# Patient Record
Sex: Male | Born: 2000 | Race: White | Hispanic: No | Marital: Single | Smoking: Never smoker
Health system: Southern US, Community
[De-identification: ages and names within clinical notes are randomized; demographics above are authoritative.]

## PROBLEM LIST (undated history)

## (undated) DIAGNOSIS — F909 Attention-deficit hyperactivity disorder, unspecified type: Secondary | ICD-10-CM

## (undated) DIAGNOSIS — G43909 Migraine, unspecified, not intractable, without status migrainosus: Secondary | ICD-10-CM

## (undated) DIAGNOSIS — K5090 Crohn's disease, unspecified, without complications: Secondary | ICD-10-CM

## (undated) DIAGNOSIS — Z8669 Personal history of other diseases of the nervous system and sense organs: Secondary | ICD-10-CM

## (undated) DIAGNOSIS — F840 Autistic disorder: Secondary | ICD-10-CM

## (undated) DIAGNOSIS — T7840XA Allergy, unspecified, initial encounter: Secondary | ICD-10-CM

## (undated) HISTORY — DX: Personal history of other diseases of the nervous system and sense organs: Z86.69

## (undated) HISTORY — DX: Migraine, unspecified, not intractable, without status migrainosus: G43.909

## (undated) HISTORY — DX: Allergy, unspecified, initial encounter: T78.40XA

## (undated) HISTORY — PX: TYMPANOSTOMY TUBE PLACEMENT: SHX32

---
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
## 2021-07-22 ENCOUNTER — Other Ambulatory Visit: Payer: Self-pay

## 2021-07-22 ENCOUNTER — Encounter (HOSPITAL_BASED_OUTPATIENT_CLINIC_OR_DEPARTMENT_OTHER): Payer: Self-pay

## 2021-07-22 ENCOUNTER — Emergency Department (HOSPITAL_BASED_OUTPATIENT_CLINIC_OR_DEPARTMENT_OTHER): Payer: Medicaid Other

## 2021-07-22 ENCOUNTER — Emergency Department (HOSPITAL_BASED_OUTPATIENT_CLINIC_OR_DEPARTMENT_OTHER)
Admission: EM | Admit: 2021-07-22 | Discharge: 2021-07-22 | Disposition: A | Discharge status: Home / Self Care | Payer: Medicaid Other | Attending: Emergency Medicine | Admitting: Emergency Medicine

## 2021-07-22 DIAGNOSIS — Z91010 Allergy to peanuts: Secondary | ICD-10-CM | POA: Insufficient documentation

## 2021-07-22 DIAGNOSIS — R110 Nausea: Secondary | ICD-10-CM | POA: Diagnosis not present

## 2021-07-22 DIAGNOSIS — K5900 Constipation, unspecified: Secondary | ICD-10-CM | POA: Diagnosis not present

## 2021-07-22 DIAGNOSIS — R1031 Right lower quadrant pain: Secondary | ICD-10-CM | POA: Diagnosis present

## 2021-07-22 LAB — CBC
HCT: 40.1 % (ref 39.0–52.0)
Hemoglobin: 13.1 g/dL (ref 13.0–17.0)
MCH: 28.5 pg (ref 26.0–34.0)
MCHC: 32.7 g/dL (ref 30.0–36.0)
MCV: 87.2 fL (ref 80.0–100.0)
Platelets: 379 10*3/uL (ref 150–400)
RBC: 4.6 MIL/uL (ref 4.22–5.81)
RDW: 12.7 % (ref 11.5–15.5)
WBC: 12.9 10*3/uL — ABNORMAL HIGH (ref 4.0–10.5)
nRBC: 0 % (ref 0.0–0.2)

## 2021-07-22 LAB — URINALYSIS, ROUTINE W REFLEX MICROSCOPIC
Bilirubin Urine: NEGATIVE
Glucose, UA: NEGATIVE mg/dL
Hgb urine dipstick: NEGATIVE
Ketones, ur: NEGATIVE mg/dL
Leukocytes,Ua: NEGATIVE
Nitrite: NEGATIVE
Specific Gravity, Urine: 1.024 (ref 1.005–1.030)
pH: 7 (ref 5.0–8.0)

## 2021-07-22 LAB — COMPREHENSIVE METABOLIC PANEL
ALT: 47 U/L — ABNORMAL HIGH (ref 0–44)
AST: 50 U/L — ABNORMAL HIGH (ref 15–41)
Albumin: 4.4 g/dL (ref 3.5–5.0)
Alkaline Phosphatase: 64 U/L (ref 38–126)
Anion gap: 11 (ref 5–15)
BUN: 12 mg/dL (ref 6–20)
CO2: 24 mmol/L (ref 22–32)
Calcium: 9.7 mg/dL (ref 8.9–10.3)
Chloride: 102 mmol/L (ref 98–111)
Creatinine, Ser: 0.84 mg/dL (ref 0.61–1.24)
GFR, Estimated: >60 mL/min (ref >60)
Glucose, Bld: 105 mg/dL — ABNORMAL HIGH (ref 70–99)
Potassium: 4.9 mmol/L (ref 3.5–5.1)
Sodium: 137 mmol/L (ref 135–145)
Total Bilirubin: 0.2 mg/dL — ABNORMAL LOW (ref 0.3–1.2)
Total Protein: 8.2 g/dL — ABNORMAL HIGH (ref 6.5–8.1)

## 2021-07-22 LAB — LIPASE, BLOOD: Lipase: 21 U/L (ref 11–51)

## 2021-07-22 MED ORDER — ONDANSETRON HCL 4 MG/2ML IJ SOLN
4.0000 mg | Freq: Once | INTRAMUSCULAR | Status: AC
Start: 2021-07-22 — End: 2021-07-22
  Administered 2021-07-22: 4 mg via INTRAVENOUS
  Filled 2021-07-22: qty 2

## 2021-07-22 MED ORDER — SODIUM CHLORIDE 0.9 % IV BOLUS
1000.0000 mL | Freq: Once | INTRAVENOUS | Status: AC
  Administered 2021-07-22: 1000 mL via INTRAVENOUS

## 2021-07-22 MED ORDER — IOHEXOL 300 MG/ML  SOLN
100.0000 mL | Freq: Once | INTRAMUSCULAR | Status: AC | PRN
  Administered 2021-07-22: 100 mL via INTRAVENOUS

## 2021-07-22 MED ORDER — POLYETHYLENE GLYCOL 3350 17 G PO PACK: 17.0000 g | PACK | Freq: Every day | ORAL | 0 refills | Status: DC

## 2021-07-22 NOTE — Discharge Instructions (Signed)
Recommend using MiraLAX for constipation.  Use 1-2 capfuls in 20 ounces of water and titrate to effect.  Can repeat daily.

## 2021-07-22 NOTE — ED Provider Notes (Signed)
[facility] Provider Note   CSN: [id number] Arrival date & time: 07/22/21  1854     History  Chief Complaint  Patient presents with   Abdominal Pain    Gregoire Favinger is a 21 y.o. male.  The history is provided by the patient.  Abdominal Pain Pain location:  RLQ Pain quality: aching   Pain radiates to:  Does not radiate Pain severity:  Mild Onset quality:  Gradual Duration:  1 day Timing:  Constant Progression:  Unchanged Chronicity:  New Context comment:  Hx of chrons on methotrexate Relieved by:  Nothing Worsened by:  Nothing Associated symptoms: nausea   Associated symptoms: no anorexia, no belching, no chest pain, no chills, no constipation, no cough, no diarrhea, no dysuria, no fatigue, no fever, no flatus, no hematemesis, no hematochezia, no melena, no shortness of breath, no sore throat and no vomiting   Risk factors: has not had multiple surgeries       Home Medications Prior to Admission medications   Medication Sig Start Date End Date Taking? Authorizing Provider  polyethylene glycol (MIRALAX / GLYCOLAX) 17 g packet Take 17 g by mouth daily. 07/22/21  Yes Felisa Zechman, DO  cloNIDine (CATAPRES) 0.2 MG tablet Take 0.4 mg by mouth at bedtime.    [provider]  CLONIDINE HCL PO Take 4 mg by mouth daily at 8 pm.    [provider]  EPINEPHrine 0.3 mg/0.3 mL IJ SOAJ injection Inject 0.3 mg into the muscle daily as needed (allergic reaction).    [provider]  fluticasone (FLONASE) 50 MCG/ACT nasal spray Place 1 spray into both nostrils daily.    [provider]  lamoTRIgine (LAMICTAL) 100 MG tablet Take 100 mg by mouth 2 (two) times daily.    [provider]  loratadine (CLARITIN) 10 MG tablet Take 10 mg by mouth every morning.    [provider]  methylphenidate [facility]) 15 mg/9hr Place 20 mg onto the skin daily. wear patch for 9 hours only each day    [provider]   montelukast (SINGULAIR) 5 MG chewable tablet Chew 5 mg by mouth at bedtime.    [provider]  ondansetron (ZOFRAN) 4 MG tablet Take 1 tablet (4 mg total) by mouth every 6 (six) hours. 03/18/14   Janne Napoleon, NP  ondansetron (ZOFRAN) 4 MG tablet Take 4 mg by mouth every 8 (eight) hours as needed for nausea or vomiting.    [provider]      Allergies    Peanuts [peanut oil]    Review of Systems   Review of Systems  Constitutional:  Negative for chills, fatigue and fever.  HENT:  Negative for sore throat.   Respiratory:  Negative for cough and shortness of breath.   Cardiovascular:  Negative for chest pain.  Gastrointestinal:  Positive for abdominal pain and nausea. Negative for anorexia, constipation, diarrhea, flatus, hematemesis, hematochezia, melena and vomiting.  Genitourinary:  Negative for dysuria.   Physical Exam Updated Vital Signs BP 131/76 (BP Location: Right Arm)    Pulse 100    Temp 98.2 F (36.8 C) (Oral)    Resp 20    Ht 5\' 7"  (1.702 m)    Wt 80.7 kg    SpO2 100%    BMI 27.88 kg/m  Physical Exam Vitals and nursing note reviewed.  Constitutional:      General: He is not in acute distress.    Appearance: He is well-developed.  HENT:  Head: Normocephalic and atraumatic.  Eyes:     Extraocular Movements: Extraocular movements intact.     Conjunctiva/sclera: Conjunctivae normal.  Cardiovascular:     Rate and Rhythm: Normal rate and regular rhythm.     Heart sounds: Normal heart sounds. No murmur heard. Pulmonary:     Effort: Pulmonary effort is normal. No respiratory distress.     Breath sounds: Normal breath sounds.  Abdominal:     Palpations: Abdomen is soft.     Tenderness: There is abdominal tenderness in the right lower quadrant.  Musculoskeletal:        General: No swelling.     Cervical back: Neck supple.  Skin:    General: Skin is warm and dry.     Capillary Refill: Capillary refill takes less than 2 seconds.  Neurological:      General: No focal deficit present.     Mental Status: He is alert.  Psychiatric:        Mood and Affect: Mood normal.    ED Results / Procedures / Treatments   Labs (all labs ordered are listed, but only abnormal results are displayed) Labs Reviewed  COMPREHENSIVE METABOLIC PANEL - Abnormal; Notable for the following components:      Result Value   Glucose, Bld 105 (*)    Total Protein 8.2 (*)    AST 50 (*)    ALT 47 (*)    Total Bilirubin 0.2 (*)    All other components within normal limits  URINALYSIS, ROUTINE W REFLEX MICROSCOPIC - Abnormal; Notable for the following components:   APPearance CLOUDY (*)    Protein, ur TRACE (*)    Bacteria, UA FEW (*)    Non Squamous Epithelial 0-5 (*)    All other components within normal limits  CBC - Abnormal; Notable for the following components:   WBC 12.9 (*)    All other components within normal limits  LIPASE, BLOOD    EKG None  Radiology CT ABDOMEN PELVIS W CONTRAST  Result Date: 07/22/2021 CLINICAL DATA:  Right lower quadrant pain EXAM: CT ABDOMEN AND PELVIS WITH CONTRAST TECHNIQUE: Multidetector CT imaging of the abdomen and pelvis was performed using the standard protocol following bolus administration of intravenous contrast. RADIATION DOSE REDUCTION: This exam was performed according to the departmental dose-optimization program which includes automated exposure control, adjustment of the mA and/or kV according to patient size and/or use of iterative reconstruction technique. CONTRAST:  144mL OMNIPAQUE IOHEXOL 300 MG/ML  SOLN COMPARISON:  None. FINDINGS: Lower chest: No acute abnormality. Small hiatal hernia with mild circumferential distal esophageal thickening. Hepatobiliary: No focal liver abnormality is seen. No gallstones, gallbladder wall thickening, or biliary dilatation. Pancreas: Unremarkable. No pancreatic ductal dilatation or surrounding inflammatory changes. Spleen: Normal in size without focal abnormality.  Adrenals/Urinary Tract: Adrenal glands are unremarkable. Kidneys are normal, without renal calculi, focal lesion, or hydronephrosis. Bladder is unremarkable. Stomach/Bowel: Stomach is within normal limits. Appendix appears normal. No evidence of bowel wall thickening, distention, or inflammatory changes. Large stool throughout the colon. Vascular/Lymphatic: No significant vascular findings are present. No enlarged abdominal or pelvic lymph nodes. Reproductive: Prostate is unremarkable. Other: 2 metallic densities overlying the low pelvis on scout image are not identified on the CT images and may represent artifacts. Musculoskeletal: No acute or significant osseous findings. IMPRESSION: 1. No CT evidence for acute intra-abdominal or pelvic abnormality. Negative for acute appendicitis 2. Large stool burden suggesting constipation Electronically Signed   By: Donavan Foil M.D.   On:  07/22/2021 21:40    Procedures Procedures    Medications Ordered in ED Medications  ondansetron (ZOFRAN) injection 4 mg (has no administration in time range)  sodium chloride 0.9 % bolus 1,000 mL (has no administration in time range)  iohexol (OMNIPAQUE) 300 MG/ML solution 100 mL (100 mLs Intravenous Contrast Given 07/22/21 2121)    ED Course/ Medical Decision Making/ A&P                           Medical Decision Making Amount and/or Complexity of Data Reviewed Labs: ordered. Radiology: ordered.  Risk Prescription drug management.   Alexnader Rucci is here with right lower quadrant abdominal pain.  Pain for the last several hours.  Normal vitals.  No fever.  History of Crohn's on methotrexate.  Denies any bloody stools.  Has had some nausea.  Tender in the right lower quadrant on exam.  No testicular pain or GU issue.  Will get CT scan to evaluate for appendicitis as differential diagnosis includes appendicitis versus Crohn's flare versus constipation versus viral process.  CBC, CMP, lipase also ordered.  Will give  IV fluid bolus, IV Zofran.  Work has been reviewed and interpreted.  There is no significant anemia, electrolyte abnormality, kidney injury.  Lipase is normal.  Gallbladder and liver enzymes within normal limits.  Doubt cholecystitis or pancreatitis.  Awaiting CT scan abdomen and pelvis.  Per radiology report CT scan consistent with constipation.  No appendicitis or other acute process.  This chart was dictated using voice recognition software.  Despite best efforts to proofread,  errors can occur which can change the documentation meaning.         Final Clinical Impression(s) / ED Diagnoses Final diagnoses:  Constipation, unspecified constipation type    Rx / DC Orders ED Discharge Orders          Ordered    polyethylene glycol (MIRALAX / GLYCOLAX) 17 g packet  Daily        07/22/21 2145              Lennice Sites, DO 07/22/21 2145

## 2021-07-22 NOTE — ED Triage Notes (Signed)
Patient here POV from Home with ABD Pain.  Began this Am somewhat and symptoms worsened throughout this afternoon. Pain is Mid ABD and radiates to Right ABD.  1G of Tylenol 1 Hour PTA. Moderate Nausea. No Emesis. History of Chron's and Autism.  NAD noted during Triage. Ambulatory.

## 2021-09-14 ENCOUNTER — Emergency Department (HOSPITAL_BASED_OUTPATIENT_CLINIC_OR_DEPARTMENT_OTHER): Payer: Medicaid Other | Admitting: Radiology

## 2021-09-14 ENCOUNTER — Other Ambulatory Visit: Payer: Self-pay

## 2021-09-14 ENCOUNTER — Encounter (HOSPITAL_BASED_OUTPATIENT_CLINIC_OR_DEPARTMENT_OTHER): Payer: Self-pay | Admitting: Emergency Medicine

## 2021-09-14 ENCOUNTER — Emergency Department (HOSPITAL_BASED_OUTPATIENT_CLINIC_OR_DEPARTMENT_OTHER)
Admission: EM | Admit: 2021-09-14 | Discharge: 2021-09-14 | Disposition: A | Discharge status: Home / Self Care | Payer: Medicaid Other | Attending: Emergency Medicine | Admitting: Emergency Medicine

## 2021-09-14 DIAGNOSIS — Z20822 Contact with and (suspected) exposure to covid-19: Secondary | ICD-10-CM | POA: Diagnosis not present

## 2021-09-14 DIAGNOSIS — R059 Cough, unspecified: Secondary | ICD-10-CM | POA: Diagnosis present

## 2021-09-14 DIAGNOSIS — J069 Acute upper respiratory infection, unspecified: Secondary | ICD-10-CM | POA: Diagnosis not present

## 2021-09-14 DIAGNOSIS — Z91010 Allergy to peanuts: Secondary | ICD-10-CM | POA: Insufficient documentation

## 2021-09-14 LAB — RESP PANEL BY RT-PCR (FLU A&B, COVID) ARPGX2
Influenza A by PCR: NEGATIVE
Influenza B by PCR: NEGATIVE
SARS Coronavirus 2 by RT PCR: NEGATIVE

## 2021-09-14 MED ORDER — ONDANSETRON 4 MG PO TBDP
4.0000 mg | ORAL_TABLET | Freq: Once | ORAL | Status: AC
  Administered 2021-09-14: 4 mg via ORAL
  Filled 2021-09-14: qty 1

## 2021-09-14 MED ORDER — KETOROLAC TROMETHAMINE 15 MG/ML IJ SOLN
15.0000 mg | Freq: Once | INTRAMUSCULAR | Status: AC
  Administered 2021-09-14: 15 mg via INTRAMUSCULAR
  Filled 2021-09-14: qty 1

## 2021-09-14 MED ORDER — ONDANSETRON HCL 4 MG PO TABS: 4.0000 mg | ORAL_TABLET | Freq: Three times a day (TID) | ORAL | 0 refills | Status: DC | PRN

## 2021-09-14 NOTE — Discharge Instructions (Addendum)
It was a pleasure taking care of you!  ? ?Your COVID swab was negative and your Flu swab was negative today. Your chest xray didn't show any acute abnormalities. You may continue with over the counter cough and cold medications as needed for your symptoms. You will be sent a prescription for zofran, take as directed. Ensure to maintain fluid intake with tea, soup, broth, Pedialyte, Gatorade, water. You may follow-up with your primary care provider as needed.  Return to the Emergency Department if you are experiencing trouble breathing, worsening or increasing chest pain, decreased fluid intake or worsening symptoms. ?

## 2021-09-14 NOTE — ED Triage Notes (Signed)
Cough/congestion/low grade fever 100, home covid test negative . Started headache and vomiting started few hours ago.  ?

## 2021-09-14 NOTE — ED Provider Notes (Signed)
?[facility] EMER[facility] DEPT ?Provider Note ? ? ?CSN: [id number] ?Arrival date & time: 09/14/21  1555 ? ?  ? ?History ? ?Chief Complaint  ?Patient presents with  ? Cough  ? ? ?Arthur Dalton is a 21 y.o. male who presents to the emergency department complaining of nonproductive cough onset last Wednesday (09/04/2021).  Patient's mother notes that he had multiple at home COVID test that resulted as negative.  He has had associated headache onset yesterday, vomiting this morning, low-grade temperature at home.  Denies sick contacts.  Patient has been given over-the-counter NyQuil, Excedrin, Mucinex, DayQuil for his symptoms.  Mother denies the patient having any allergies to medications.  Patient denies rhinorrhea. ? ? ? ?The history is provided by a relative and the patient. No language interpreter was used.  ? ?  ? ?Home Medications ?Prior to Admission medications   ?Medication Sig Start Date End Date Taking? Authorizing Provider  ?ondansetron (ZOFRAN) 4 MG tablet Take 1 tablet (4 mg total) by mouth every 8 (eight) hours as needed for nausea or vomiting. 09/14/21  Yes Rhyland Hinderliter A, PA-C  ?cloNIDine (CATAPRES) 0.2 MG tablet Take 0.4 mg by mouth at bedtime.    [provider]  ?CLONIDINE HCL PO Take 4 mg by mouth daily at 8 pm.    [provider]  ?EPINEPHrine 0.3 mg/0.3 mL IJ SOAJ injection Inject 0.3 mg into the muscle daily as needed (allergic reaction).    [provider]  ?fluticasone (FLONASE) 50 MCG/ACT nasal spray Place 1 spray into both nostrils daily.    [provider]  ?lamoTRIgine (LAMICTAL) 100 MG tablet Take 100 mg by mouth 2 (two) times daily.    [provider]  ?loratadine (CLARITIN) 10 MG tablet Take 10 mg by mouth every morning.    [provider]  ?methylphenidate (DAYTRANA) 15 mg/9hr Place 20 mg onto the skin daily. wear patch for 9 hours only each day    [provider]  ?montelukast (SINGULAIR) 5 MG chewable tablet Chew 5  mg by mouth at bedtime.    [provider]  ?polyethylene glycol (MIRALAX / GLYCOLAX) 17 g packet Take 17 g by mouth daily. 07/22/21   Virgina Norfolk, DO  ?   ? ?Allergies    ?Peanuts [peanut oil]   ? ?Review of Systems   ?Review of Systems  ?Constitutional:  Positive for fever.  ?HENT:  Positive for congestion. Negative for rhinorrhea.   ?Respiratory:  Positive for cough.   ?All other systems reviewed and are negative. ? ?Physical Exam ?Updated Vital Signs ?BP 129/87 (BP Location: Right Arm)   Pulse (!) 105   Temp 98.1 ?F (36.7 ?C) (Oral)   Resp 18   SpO2 99%  ?Physical Exam ?Vitals and nursing note reviewed.  ?Constitutional:   ?   General: He is not in acute distress. ?   Appearance: He is not diaphoretic.  ?HENT:  ?   Head: Normocephalic and atraumatic.  ?   Right Ear: Tympanic membrane, ear canal and external ear normal.  ?   Left Ear: Tympanic membrane, ear canal and external ear normal.  ?   Mouth/Throat:  ?   Mouth: Mucous membranes are moist.  ?   Pharynx: Oropharynx is clear. Uvula midline. No oropharyngeal exudate, posterior oropharyngeal erythema or uvula swelling.  ?   Tonsils: No tonsillar exudate.  ?Eyes:  ?   General: No scleral icterus. ?   Conjunctiva/sclera: Conjunctivae normal.  ?Cardiovascular:  ?   Rate and  Rhythm: Normal rate and regular rhythm.  ?   Pulses: Normal pulses.  ?   Heart sounds: Normal heart sounds.  ?Pulmonary:  ?   Effort: Pulmonary effort is normal. No respiratory distress.  ?   Breath sounds: Normal breath sounds. No wheezing.  ?Abdominal:  ?   General: Bowel sounds are normal.  ?   Palpations: Abdomen is soft. There is no mass.  ?   Tenderness: There is no abdominal tenderness. There is no guarding or rebound.  ?Musculoskeletal:     ?   General: Normal range of motion.  ?   Cervical back: Normal range of motion and neck supple.  ?Skin: ?   General: Skin is warm and dry.  ?Neurological:  ?   Mental Status: He is alert.  ?Psychiatric:     ?   Behavior: Behavior  normal.  ? ? ?ED Results / Procedures / Treatments   ?Labs ?(all labs ordered are listed, but only abnormal results are displayed) ?Labs Reviewed  ?RESP PANEL BY RT-PCR (FLU A&B, COVID) [id number]  ? ? ?EKG ?None ? ?Radiology ?DG Chest 2 View ? ?Result Date: 09/14/2021 ?CLINICAL DATA:  Cough, congestion and low-grade fever. Headache and vomiting. EXAM: CHEST - 2 VIEW COMPARISON:  Radiographs 03/20/2014. FINDINGS: Suboptimal inspiration on both views. The heart size and mediastinal contours are stable. Probable mild central airway thickening and bibasilar atelectasis. No confluent airspace opacity, pleural effusion or pneumothorax. The bones appear unremarkable. IMPRESSION: Suboptimal inspiration with probable bibasilar atelectasis and mild underlying central airway thickening as can be seen with viral infection. No evidence of pneumonia. Electronically Signed   By: Richardean Sale M.D.   On: 09/14/2021 18:23   ? ?Procedures ?Procedures  ? ? ?Medications Ordered in ED ?Medications  ?ondansetron (ZOFRAN-ODT) disintegrating tablet 4 mg (4 mg Oral Given 09/14/21 1827)  ?ketorolac (TORADOL) 15 MG/ML injection 15 mg (15 mg Intramuscular Given 09/14/21 1827)  ? ? ?ED Course/ Medical Decision Making/ A&P ?Clinical Course as of 09/14/21 1958  ?Sat Sep 14, 2021  ?1929 Discussed with mother regarding x-ray.  Patient reevaluated and noted improvement of his symptoms with treatment regimen in the ED.  Discussed discharge treatment plan.  Answered all available questions.  Patient for safe discharge. [SB]  ?  ?Clinical Course User Index ?[SB] Candies Palm A, PA-C  ? ?                        ?Medical Decision Making ?Amount and/or Complexity of Data Reviewed ?Radiology: ordered. ? ?Risk ?Prescription drug management. ? ? ?Pt presents with nonproductive cough, nasal congestion, low-grade fever at home onset 09/04/2021.  Mother denies sick contacts.  Patient has been given over-the-counter medications for his symptoms.  Vital signs stable,  patient afebrile, not tachycardic or hypoxic.  On exam patient without acute cardiovascular, respiratory, abdominal skin findings.  Differential diagnosis includes COVID, flu, pneumonia, viral URI with cough. ? ?Additional history obtained:  ?Additional history obtained from Parent ? ?Labs:  ?I ordered, and personally interpreted labs.  The pertinent results include:   ?COVID and flu swab negative ? ?Imaging: ?I ordered imaging studies including chest x-ray ?I independently visualized and interpreted imaging which showed: No acute cardiopulmonary findings. ?I agree with the radiologist interpretation ? ?Medications:  ?I ordered medication including Toradol and Zofran for symptom management ?Reevaluation of the patient after these medicines and interventions, I reevaluated the patient and found that they have improved ?I have reviewed the patients home  medicines and have made adjustments as needed ? ? ?Disposition: ?Patient presentation suspicious for viral URI with cough.  Doubt COVID, flu, pneumonia at this time. After consideration of the diagnostic results and the patients response to treatment, I feel that the patient would benefit from Discharge home.  Discussed with patient's mother regarding discharge treatment plan.  We will send a prescription for Zofran to use as needed.  Supportive care measures and strict return precautions discussed with patient's mother at bedside.  Patient mother acknowledges and verbalized understanding.  Patient for safe discharge.  Follow-up as indicated discharge paperwork.   ? ? ?This chart was dictated using voice recognition software, Dragon. Despite the best efforts of this provider to proofread and correct errors, errors may still occur which can change documentation meaning. ? ? ?Final Clinical Impression(s) / ED Diagnoses ?Final diagnoses:  ?Viral URI with cough  ? ? ?Rx / DC Orders ?ED Discharge Orders   ? ?      Ordered  ?  ondansetron (ZOFRAN) 4 MG tablet  Every 8 hours  PRN       ? 09/14/21 1927  ? ?  ?  ? ?  ? ? ?  ?Mallerie Blok A, PA-C ?09/14/21 2002 ? ?  ?Wyvonnia Dusky, MD ?09/15/21 [phone] ? ?

## 2021-10-21 ENCOUNTER — Encounter (HOSPITAL_COMMUNITY): Payer: Self-pay | Admitting: Emergency Medicine

## 2021-10-21 ENCOUNTER — Ambulatory Visit (HOSPITAL_COMMUNITY)
Admission: EM | Admit: 2021-10-21 | Discharge: 2021-10-21 | Disposition: A | Discharge status: Home / Self Care | Payer: Medicaid Other | Attending: Internal Medicine | Admitting: Internal Medicine

## 2021-10-21 ENCOUNTER — Ambulatory Visit (INDEPENDENT_AMBULATORY_CARE_PROVIDER_SITE_OTHER): Payer: Medicaid Other

## 2021-10-21 DIAGNOSIS — R053 Chronic cough: Secondary | ICD-10-CM

## 2021-10-21 DIAGNOSIS — R059 Cough, unspecified: Secondary | ICD-10-CM | POA: Diagnosis not present

## 2021-10-21 DIAGNOSIS — R509 Fever, unspecified: Secondary | ICD-10-CM

## 2021-10-21 MED ORDER — BENZONATATE 100 MG PO CAPS: 100.0000 mg | ORAL_CAPSULE | Freq: Three times a day (TID) | ORAL | 0 refills | Status: DC | PRN

## 2021-10-21 MED ORDER — PREDNISONE 10 MG (21) PO TBPK: ORAL_TABLET | Freq: Every day | ORAL | 0 refills | Status: DC

## 2021-10-21 NOTE — ED Provider Notes (Signed)
?[facility] ? ? ? ?CSN: [id number] ?Arrival date & time: 10/21/21  1521 ? ? ?  ? ?History   ?Chief Complaint ?Chief Complaint  ?Patient presents with  ? Cough  ? Fever  ? ? ?HPI ?Arthur Dalton is a 21 y.o. male.  ? ?Patient presents with cough and fever.  Cough has been present for approximately 2 months per parent.  Fever developed a few days ago with Tmax of 100.  Denies any associated upper respiratory symptoms.  Patient was seen at ED when symptoms first started and a full work-up that concluded that a viral illness was present.  Parent denies any associated asthma or wheezing.  Parent denies noticing any rapid breathing or shortness of breath. ? ? ?Cough ?Fever ? ?Past Medical History:  ?Diagnosis Date  ? ADHD (attention deficit hyperactivity disorder)   ? Autism   ? Crohn disease (HCC)   ? ? ?There are no problems to display for this patient. ? ? ?Past Surgical History:  ?Procedure Laterality Date  ? TYMPANOSTOMY TUBE PLACEMENT    ? ? ? ? ? ?Home Medications   ? ?Prior to Admission medications   ?Medication Sig Start Date End Date Taking? Authorizing Provider  ?benzonatate (TESSALON) 100 MG capsule Take 1 capsule (100 mg total) by mouth every 8 (eight) hours as needed for cough. 10/21/21  Yes Gustavus Bryant, FNP  ?predniSONE (STERAPRED UNI-PAK 21 TAB) 10 MG (21) TBPK tablet Take by mouth daily. Take 6 tabs by mouth daily  for 2 days, then 5 tabs for 2 days, then 4 tabs for 2 days, then 3 tabs for 2 days, 2 tabs for 2 days, then 1 tab by mouth daily for 2 days 10/21/21  Yes [place], Rolly Salter E, FNP  ?cloNIDine (CATAPRES) 0.2 MG tablet Take 0.4 mg by mouth at bedtime.    [provider]  ?CLONIDINE HCL PO Take 4 mg by mouth daily at 8 pm.    [provider]  ?EPINEPHrine 0.3 mg/0.3 mL IJ SOAJ injection Inject 0.3 mg into the muscle daily as needed (allergic reaction).    [provider]  ?fluticasone (FLONASE) 50 MCG/ACT nasal spray Place 1 spray into both nostrils daily.     [provider]  ?lamoTRIgine (LAMICTAL) 100 MG tablet Take 100 mg by mouth 2 (two) times daily.    [provider]  ?loratadine (CLARITIN) 10 MG tablet Take 10 mg by mouth every morning.    [provider]  ?methylphenidate (DAYTRANA) 15 mg/9hr Place 20 mg onto the skin daily. wear patch for 9 hours only each day    [provider]  ?montelukast (SINGULAIR) 5 MG chewable tablet Chew 5 mg by mouth at bedtime.    [provider]  ?ondansetron (ZOFRAN) 4 MG tablet Take 1 tablet (4 mg total) by mouth every 8 (eight) hours as needed for nausea or vomiting. 09/14/21   Blue, Soijett A, PA-C  ?polyethylene glycol (MIRALAX / GLYCOLAX) 17 g packet Take 17 g by mouth daily. 07/22/21   Virgina Norfolk, DO  ? ? ?Family History ?Family History  ?Problem Relation Age of Onset  ? Hypertension Mother   ? Hypertension Father   ? ? ?Social History ?Social History  ? ?Tobacco Use  ? Smoking status: Never  ?Substance Use Topics  ? Alcohol use: Never  ? Drug use: Never  ? ? ? ?Allergies   ?Peanuts [peanut oil] ? ? ?Review of Systems ?Review of Systems ?Per HPI ? ?Physical Exam ?  Triage Vital Signs ?ED Triage Vitals [10/21/21 1657]  ?Enc Vitals Group  ?   BP 113/76  ?   Pulse Rate 80  ?   Resp 18  ?   Temp 98.3 ?F (36.8 ?C)  ?   Temp Source Oral  ?   SpO2 98 %  ?   Weight   ?   Height   ?   Head Circumference   ?   Peak Flow   ?   Pain Score 0  ?   Pain Loc   ?   Pain Edu?   ?   Excl. in GC?   ? ?No data found. ? ?Updated Vital Signs ?BP 113/76   Pulse 80   Temp 98.3 ?F (36.8 ?C) (Oral)   Resp 18   SpO2 98%  ? ?Visual Acuity ?Right Eye Distance:   ?Left Eye Distance:   ?Bilateral Distance:   ? ?Right Eye Near:   ?Left Eye Near:    ?Bilateral Near:    ? ?Physical Exam ?Constitutional:   ?   General: He is not in acute distress. ?   Appearance: Normal appearance. He is not toxic-appearing or diaphoretic.  ?HENT:  ?   Head: Normocephalic and atraumatic.  ?   Right Ear: Tympanic membrane and ear  canal normal.  ?   Left Ear: Tympanic membrane and ear canal normal.  ?   Nose: Nose normal.  ?   Mouth/Throat:  ?   Mouth: Mucous membranes are moist.  ?   Pharynx: No posterior oropharyngeal erythema.  ?Eyes:  ?   Extraocular Movements: Extraocular movements intact.  ?   Conjunctiva/sclera: Conjunctivae normal.  ?   Pupils: Pupils are equal, round, and reactive to light.  ?Cardiovascular:  ?   Rate and Rhythm: Normal rate and regular rhythm.  ?   Pulses: Normal pulses.  ?   Heart sounds: Normal heart sounds.  ?Pulmonary:  ?   Effort: Pulmonary effort is normal. No respiratory distress.  ?   Breath sounds: Normal breath sounds. No stridor. No wheezing, rhonchi or rales.  ?Abdominal:  ?   General: Abdomen is flat. Bowel sounds are normal.  ?   Palpations: Abdomen is soft.  ?Musculoskeletal:     ?   General: Normal range of motion.  ?   Cervical back: Normal range of motion.  ?Skin: ?   General: Skin is warm and dry.  ?Neurological:  ?   General: No focal deficit present.  ?   Mental Status: He is alert and oriented to person, place, and time. Mental status is at baseline.  ?Psychiatric:     ?   Mood and Affect: Mood normal.     ?   Behavior: Behavior normal.  ? ? ? ?UC Treatments / Results  ?Labs ?(all labs ordered are listed, but only abnormal results are displayed) ?Labs Reviewed - No data to display ? ?EKG ? ? ?Radiology ?DG Chest 2 View ? ?Result Date: 10/21/2021 ?CLINICAL DATA:  Cough and fever in a 21 year old male. EXAM: CHEST - 2 VIEW COMPARISON:  September 14, 2021. FINDINGS: Trachea midline. Cardiomediastinal contours and hilar structures are normal. No sign of lobar consolidation. No sign of pleural effusion. No visible pneumothorax. On limited assessment there is no acute skeletal finding. IMPRESSION: No acute cardiopulmonary disease Electronically Signed   By: Donzetta KohutGeoffrey  Wile M.D.   On: 10/21/2021 17:24   ? ?Procedures ?Procedures (including critical care time) ? ?Medications Ordered in UC ?Medications -  No  data to display ? ?Initial Impression / Assessment and Plan / UC Course  ?I have reviewed the triage vital signs and the nursing notes. ? ?Pertinent labs & imaging results that were available during my care of the patient were reviewed by me and considered in my medical decision making (see chart for details). ? ?  ? ?Chest x-ray was negative for any acute cardiopulmonary process.  Suspect possible bronchitis.  No concern or need for antibiotics at this time given physical exam, no adventitious lung sounds, normal chest x-ray, no current fever, no acute distress on exam.  Will treat with prednisone steroid taper to decrease inflammation.  Discussed supportive care and fever monitoring and management.  Benzonatate prescribed to take as needed for cough.  Advised parent to have patient follow-up if symptoms persist or worsen.  Parent verbalized understanding and was agreeable with plan. ?Final Clinical Impressions(s) / UC Diagnoses  ? ?Final diagnoses:  ?Persistent cough for 3 weeks or longer  ? ? ? ?Discharge Instructions   ? ?  ?chest x-ray was completely normal.  Suspect possible bronchitis.  Prednisone has been prescribed to help alleviate inflammation associated with this.  No antibiotics are necessary at this time.  A cough medication has also been prescribed to take as needed.  Please follow-up if symptoms persist ? ? ? ?ED Prescriptions   ? ? Medication Sig Dispense Auth. Provider  ? predniSONE (STERAPRED UNI-PAK 21 TAB) 10 MG (21) TBPK tablet Take by mouth daily. Take 6 tabs by mouth daily  for 2 days, then 5 tabs for 2 days, then 4 tabs for 2 days, then 3 tabs for 2 days, 2 tabs for 2 days, then 1 tab by mouth daily for 2 days 42 tablet [place], [place] E, [place]  ? benzonatate (TESSALON) 100 MG capsule Take 1 capsule (100 mg total) by mouth every 8 (eight) hours as needed for cough. 21 capsule Ervin Knack E, [place]  ? ?  ? ?PDMP not reviewed this encounter. ?  ?Gustavus Bryant, [place] ?10/21/21 1740 ? ?

## 2021-10-21 NOTE — ED Triage Notes (Signed)
Pt is present today with cough and fever. Pt sx started x2 months ago.  ?

## 2021-10-21 NOTE — Discharge Instructions (Signed)
chest x-ray was completely normal.  Suspect possible bronchitis.  Prednisone has been prescribed to help alleviate inflammation associated with this.  No antibiotics are necessary at this time.  A cough medication has also been prescribed to take as needed.  Please follow-up if symptoms persist ?

## 2022-05-08 ENCOUNTER — Ambulatory Visit (HOSPITAL_COMMUNITY)
Admission: RE | Admit: 2022-05-08 | Discharge: 2022-05-08 | Disposition: A | Discharge status: Home / Self Care | Payer: Medicaid Other | Source: Ambulatory Visit

## 2022-05-08 ENCOUNTER — Encounter (HOSPITAL_COMMUNITY): Payer: Self-pay

## 2022-05-08 VITALS — BP 110/75 | HR 66 | Temp 98.0°F | Resp 18

## 2022-05-08 DIAGNOSIS — H6001 Abscess of right external ear: Secondary | ICD-10-CM | POA: Diagnosis not present

## 2022-05-08 MED ORDER — MUPIROCIN 2 % EX OINT: 1.0000 | TOPICAL_OINTMENT | Freq: Two times a day (BID) | CUTANEOUS | 0 refills | Status: DC

## 2022-05-08 MED ORDER — DOXYCYCLINE HYCLATE 100 MG PO TABS: 100.0000 mg | ORAL_TABLET | Freq: Two times a day (BID) | ORAL | 0 refills | Status: AC

## 2022-05-08 NOTE — ED Triage Notes (Signed)
Pts parents state that starting on 04/28/22 he started with some dry skin behind the right ear but its getting worse now it looks infected. They have been putting antibiotic ointment, cleaning it with antibacterial wipes.

## 2022-05-08 NOTE — ED Provider Notes (Signed)
[facility]    CSN: [id number] Arrival date & time: 05/08/22  1453      History   Chief Complaint Chief Complaint  Patient presents with   Ear Injury    Ear lobe and behind the ear is inflamed and skin is cracked and weeping. Ear lobe is swollen as well. Started as mild iritation behind the ear on or about the 20th of November. No improvement even with washing and ointments otc. - Entered by patient    HPI Arthur Dalton is a 21 y.o. male with autism.  History provided by mother and father in room with patient.  Parents report zit like lesion on right earlobe last month which drained purulent fluid.  Patient now with 10-day history of increasing redness and swelling to right earlobe.  Parents state patient has significant eczema usually resolved with CeraVe which has not worked in this situation.  Parents deny any recent fever or chills.  Patient on Remicade and methotrexate for history of Crohn's disease.    Past Medical History:  Diagnosis Date   ADHD (attention deficit hyperactivity disorder)    Autism    Crohn disease (HCC)     There are no problems to display for this patient.   Past Surgical History:  Procedure Laterality Date   TYMPANOSTOMY TUBE PLACEMENT         Home Medications    Prior to Admission medications   Medication Sig Start Date End Date Taking? Authorizing Provider  cloNIDine (CATAPRES) 0.2 MG tablet Take 0.4 mg by mouth at bedtime.   Yes [provider]  CLONIDINE HCL PO Take 4 mg by mouth daily at 8 pm.   Yes [provider]  doxycycline (VIBRA-TABS) 100 MG tablet Take 1 tablet (100 mg total) by mouth 2 (two) times daily for 5 days. 05/08/22 05/13/22 Yes Rolla Etienne, NP  EPINEPHrine 0.3 mg/0.3 mL IJ SOAJ injection Inject 0.3 mg into the muscle daily as needed (allergic reaction).   Yes [provider]  fluticasone (FLONASE) 50 MCG/ACT nasal spray Place 1 spray into both nostrils daily.   Yes [provider]  inFLIXimab (REMICADE IV) Inject into the vein. Every 6 weeks   Yes [provider]  lamoTRIgine (LAMICTAL) 100 MG tablet Take 100 mg by mouth 2 (two) times daily.   Yes [provider]  loratadine (CLARITIN) 10 MG tablet Take 10 mg by mouth every morning.   Yes [provider]  methotrexate (RHEUMATREX) 2.5 MG tablet Take by mouth. 03/24/22  Yes [provider]  mupirocin ointment (BACTROBAN) 2 % Apply 1 Application topically 2 (two) times daily. Apply twice daily as needed to affected area 05/08/22  Yes Rolla Etienne, NP  ondansetron (ZOFRAN) 4 MG tablet Take 1 tablet (4 mg total) by mouth every 8 (eight) hours as needed for nausea or vomiting. 09/14/21  Yes Blue, Soijett A, PA-C  risperiDONE (RISPERDAL) 1 MG tablet Take 1 mg by mouth at bedtime.   Yes [provider]  benzonatate (TESSALON) 100 MG capsule Take 1 capsule (100 mg total) by mouth every 8 (eight) hours as needed for cough. 10/21/21   Gustavus Bryant, FNP  methylphenidate [facility]) 15 mg/9hr Place 20 mg onto the skin daily. wear patch for 9 hours only each day    [provider]  montelukast (SINGULAIR) 5 MG chewable tablet Chew 5 mg by mouth at bedtime.    [provider]  polyethylene glycol (MIRALAX / GLYCOLAX) 17  g packet Take 17 g by mouth daily. 07/22/21   Curatolo, Adam, DO  predniSONE (STERAPRED UNI-PAK 21 TAB) 10 MG (21) TBPK tablet Take by mouth daily. Take 6 tabs by mouth daily  for 2 days, then 5 tabs for 2 days, then 4 tabs for 2 days, then 3 tabs for 2 days, 2 tabs for 2 days, then 1 tab by mouth daily for 2 days 10/21/21   Gustavus Bryant, FNP    Family History Family History  Problem Relation Age of Onset   Hypertension Mother    Hypertension Father     Social History Social History   Tobacco Use   Smoking status: Never  Substance Use Topics   Alcohol use: Never   Drug use: Never     Allergies   Peanuts [peanut oil]   Review of  Systems As stated in HPI otherwise negative   Physical Exam Triage Vital Signs ED Triage Vitals  Enc Vitals Group     BP 05/08/22 1540 110/75     Pulse Rate 05/08/22 1540 66     Resp 05/08/22 1540 18     Temp 05/08/22 1540 98 F (36.7 C)     Temp Source 05/08/22 1540 Oral     SpO2 05/08/22 1540 99 %     Weight --      Height --      Head Circumference --      Peak Flow --      Pain Score 05/08/22 1536 0     Pain Loc --      Pain Edu? --      Excl. in GC? --    No data found.  Updated Vital Signs BP 110/75 (BP Location: Left Arm)   Pulse 66   Temp 98 F (36.7 C) (Oral)   Resp 18   SpO2 99%   Visual Acuity Right Eye Distance:   Left Eye Distance:   Bilateral Distance:    Right Eye Near:   Left Eye Near:    Bilateral Near:     Physical Exam Constitutional:      General: He is not in acute distress.    Appearance: Normal appearance. He is not ill-appearing or toxic-appearing.  Skin:    General: Skin is warm and dry.     Comments: Swelling and erythema to right earlobe with mild fluctuance.  Mild postauricular erythema with significant scaling of skin on lobe and behind ear.  No lymphadenopathy  Neurological:     Mental Status: He is alert.      UC Treatments / Results  Labs (all labs ordered are listed, but only abnormal results are displayed) Labs Reviewed - No data to display  EKG   Radiology No results found.  Procedures Procedures (including critical care time)  Medications Ordered in UC Medications - No data to display  Initial Impression / Assessment and Plan / UC Course  I have reviewed the triage vital signs and the nursing notes.  Pertinent labs & imaging results that were available during my care of the patient were reviewed by me and considered in my medical decision making (see chart for details).  Abscess, right earlobe -Exam suspicious for developing abscess.  Patient parents report recent visit in affected area with purulent  drainage likely contributing to abscess.  No evidence that infection is spreading beyond earlobe.  Patient also immunocompromised on MTX and Remicade -Doxy twice daily x 5 days -Topical mupirocin -For any worsening swelling or redness  Reviewed expections re: course of current medical issues. Questions answered. Outlined signs and symptoms indicating need for more acute intervention. Pt verbalized understanding. AVS given  Final Clinical Impressions(s) / UC Diagnoses   Final diagnoses:  Abscess of right earlobe     Discharge Instructions      I am concerned Orpah Clinton is developing an abscess on his earlobe.  As we discussed, I will go ahead and start him on antibiotic.  Take doxycycline twice daily for 5 days.  Use topical mupirocin ointment on affected area as needed.  Please have him follow-up with his primary care provider.  Otherwise, follow-up immediately for any worsening swelling, redness, fever or chills.     ED Prescriptions     Medication Sig Dispense Auth. Provider   doxycycline (VIBRA-TABS) 100 MG tablet Take 1 tablet (100 mg total) by mouth 2 (two) times daily for 5 days. 10 tablet Rolla Etienne, NP   mupirocin ointment (BACTROBAN) 2 % Apply 1 Application topically 2 (two) times daily. Apply twice daily as needed to affected area 22 g Rolla Etienne, NP      PDMP not reviewed this encounter.   Rolla Etienne, NP 05/08/22 [phone]

## 2022-05-08 NOTE — Discharge Instructions (Addendum)
I am concerned Arthur Dalton is developing an abscess on his earlobe.  As we discussed, I will go ahead and start him on antibiotic.  Take doxycycline twice daily for 5 days.  Use topical mupirocin ointment on affected area as needed.  Please have him follow-up with his primary care provider.  Otherwise, follow-up immediately for any worsening swelling, redness, fever or chills.

## 2022-05-27 ENCOUNTER — Encounter (HOSPITAL_COMMUNITY): Payer: Self-pay

## 2022-05-27 ENCOUNTER — Ambulatory Visit (HOSPITAL_COMMUNITY)
Admission: RE | Admit: 2022-05-27 | Discharge: 2022-05-27 | Disposition: A | Discharge status: Home / Self Care | Payer: Medicaid Other | Source: Ambulatory Visit | Attending: Pediatrics | Admitting: Pediatrics

## 2022-05-27 VITALS — BP 112/75 | HR 69 | Temp 98.3°F | Resp 18

## 2022-05-27 DIAGNOSIS — H01131 Eczematous dermatitis of right upper eyelid: Secondary | ICD-10-CM

## 2022-05-27 DIAGNOSIS — H01134 Eczematous dermatitis of left upper eyelid: Secondary | ICD-10-CM | POA: Diagnosis not present

## 2022-05-27 NOTE — ED Provider Notes (Signed)
[facility]    CSN: [id number] Arrival date & time: 05/27/22  1504      History   Chief Complaint Chief Complaint  Patient presents with   Eye Problem    Right eyelid is red, dry and has bumps, iritation. Swollen and puffy under right eye. Some red blotchy swelling under left eye. History of eczema. - Entered by patient   Rash    HPI Arthur Dalton is a 21 y.o. male.   21 year old male presents with eyelid irritation.  Father indicates that the patient has had some bilateral upper eyelid irritation, mild skin redness, itchiness for the past couple days.  Father indicates there may be some irritation on the lower eyelid as well.  There is no drainage from the eyes.  There is no known contact to irritants.  Concerned that there may be a little bit of eczema or irritant dermatitis occurring along the eyelids.   Eye Problem Rash   Past Medical History:  Diagnosis Date   ADHD (attention deficit hyperactivity disorder)    Autism    Crohn disease (HCC)     There are no problems to display for this patient.   Past Surgical History:  Procedure Laterality Date   TYMPANOSTOMY TUBE PLACEMENT         Home Medications    Prior to Admission medications   Medication Sig Start Date End Date Taking? Authorizing Provider  benzonatate (TESSALON) 100 MG capsule Take 1 capsule (100 mg total) by mouth every 8 (eight) hours as needed for cough. 10/21/21  Yes [place], Rolly Salter E, FNP  cloNIDine (CATAPRES) 0.2 MG tablet Take 0.4 mg by mouth at bedtime.   Yes [provider]  CLONIDINE HCL PO Take 4 mg by mouth daily at 8 pm.   Yes [provider]  EPINEPHrine 0.3 mg/0.3 mL IJ SOAJ injection Inject 0.3 mg into the muscle daily as needed (allergic reaction).   Yes [provider]  fluticasone (FLONASE) 50 MCG/ACT nasal spray Place 1 spray into both nostrils daily.   Yes [provider]  inFLIXimab (REMICADE IV) Inject into the vein. Every 6 weeks    Yes [provider]  lamoTRIgine (LAMICTAL) 100 MG tablet Take 100 mg by mouth 2 (two) times daily.   Yes [provider]  loratadine (CLARITIN) 10 MG tablet Take 10 mg by mouth every morning.   Yes [provider]  methotrexate (RHEUMATREX) 2.5 MG tablet Take by mouth. 03/24/22  Yes [provider]  methylphenidate (DAYTRANA) 15 mg/9hr Place 20 mg onto the skin daily. wear patch for 9 hours only each day   Yes [provider]  montelukast (SINGULAIR) 5 MG chewable tablet Chew 5 mg by mouth at bedtime.   Yes [provider]  mupirocin ointment (BACTROBAN) 2 % Apply 1 Application topically 2 (two) times daily. Apply twice daily as needed to affected area 05/08/22  Yes Rolla Etienne, NP  ondansetron (ZOFRAN) 4 MG tablet Take 1 tablet (4 mg total) by mouth every 8 (eight) hours as needed for nausea or vomiting. 09/14/21  Yes Blue, Soijett A, PA-C  polyethylene glycol (MIRALAX / GLYCOLAX) 17 g packet Take 17 g by mouth daily. 07/22/21  Yes Curatolo, Adam, DO  predniSONE (STERAPRED UNI-PAK 21 TAB) 10 MG (21) TBPK tablet Take by mouth daily. Take 6 tabs by mouth daily  for 2 days, then 5 tabs for 2 days, then 4 tabs for 2 days, then 3 tabs for 2 days, 2  tabs for 2 days, then 1 tab by mouth daily for 2 days 10/21/21  Yes [place], [place] E, FNP  risperiDONE (RISPERDAL) 1 MG tablet Take 1 mg by mouth at bedtime.   Yes [provider]    Family History Family History  Problem Relation Age of Onset   Hypertension Mother    Hypertension Father     Social History Social History   Tobacco Use   Smoking status: Never  Substance Use Topics   Alcohol use: Never   Drug use: Never     Allergies   Peanuts [peanut oil]   Review of Systems Review of Systems  Skin:  Positive for rash (eyelids).     Physical Exam Triage Vital Signs ED Triage Vitals  Enc Vitals Group     BP 05/27/22 1602 112/75     Pulse Rate 05/27/22 1602 69     Resp  05/27/22 1602 18     Temp 05/27/22 1602 98.3 F (36.8 C)     Temp Source 05/27/22 1602 Oral     SpO2 05/27/22 1602 96 %     Weight --      Height --      Head Circumference --      Peak Flow --      Pain Score 05/27/22 1601 0     Pain Loc --      Pain Edu? --      Excl. in GC? --    No data found.  Updated Vital Signs BP 112/75 (BP Location: Left Arm)   Pulse 69   Temp 98.3 F (36.8 C) (Oral)   Resp 18   SpO2 96%   Visual Acuity Right Eye Distance:   Left Eye Distance:   Bilateral Distance:    Right Eye Near:   Left Eye Near:    Bilateral Near:     Physical Exam Constitutional:      Appearance: Normal appearance.  HENT:     Mouth/Throat:     Comments: Facial: Skin appears normal with minimal redness around the facial and cheek areas but no evidence of any dermatitis present. Eyes:     Comments: Eyelids: There is some minimal faint redness of irritant dermatitis present both upper eyelids with the right greater than the left.  This is still very faint.  The lower eyelids appears normal without any swelling, redness or drainage.  Neurological:     Mental Status: He is alert.      UC Treatments / Results  Labs (all labs ordered are listed, but only abnormal results are displayed) Labs Reviewed - No data to display  EKG   Radiology No results found.  Procedures Procedures (including critical care time)  Medications Ordered in UC Medications - No data to display  Initial Impression / Assessment and Plan / UC Course  I have reviewed the triage vital signs and the nursing notes.  Pertinent labs & imaging results that were available during my care of the patient were reviewed by me and considered in my medical decision making (see chart for details).    Plan: 1.  The dermatitis of the upper eyelids will be treated with the following: A.  Advised the father to use just a thin layer of Vaseline ointment to the upper and lower eyelids as this is safe and  it will tend to allow the skin to heal on its own since its an occlusive type fail. 2.  Advised follow-up PCP or return to urgent  care as needed. Final Clinical Impressions(s) / UC Diagnoses   Final diagnoses:  Eczematous dermatitis of upper eyelids of both eyes     Discharge Instructions      Advised to use a light layer on Vaseline ointment to the upper eyelids and underneath the lower eyelids as this will allow the skin to heal on its own without causing any problems or skin reaction.  Advised to follow-up with PCP or return to urgent care if symptoms fail to improve.    ED Prescriptions   None    PDMP not reviewed this encounter.   Ellsworth Lennox, PA-C 05/27/22 1617

## 2022-05-27 NOTE — ED Triage Notes (Signed)
Pts father states he has some dry patches on his eye lids and under his eye its worse after showers X 5 days. They didn't know what to try OTC.

## 2022-05-27 NOTE — Discharge Instructions (Addendum)
Advised to use a light layer on Vaseline ointment to the upper eyelids and underneath the lower eyelids as this will allow the skin to heal on its own without causing any problems or skin reaction.  Advised to follow-up with PCP or return to urgent care if symptoms fail to improve.

## 2022-07-14 ENCOUNTER — Encounter (HOSPITAL_COMMUNITY): Payer: Self-pay

## 2022-07-14 ENCOUNTER — Ambulatory Visit (HOSPITAL_COMMUNITY)
Admission: RE | Admit: 2022-07-14 | Discharge: 2022-07-14 | Disposition: A | Discharge status: Home / Self Care | Payer: Medicaid Other | Source: Ambulatory Visit | Attending: Nurse Practitioner | Admitting: Nurse Practitioner

## 2022-07-14 VITALS — BP 119/83 | HR 96 | Temp 98.2°F | Resp 20

## 2022-07-14 DIAGNOSIS — J069 Acute upper respiratory infection, unspecified: Secondary | ICD-10-CM

## 2022-07-14 LAB — POC INFLUENZA A AND B ANTIGEN (URGENT CARE ONLY)
INFLUENZA A ANTIGEN, POC: NEGATIVE
INFLUENZA B ANTIGEN, POC: NEGATIVE

## 2022-07-14 LAB — POCT RAPID STREP A, ED / UC: Streptococcus, Group A Screen (Direct): NEGATIVE

## 2022-07-14 NOTE — ED Triage Notes (Addendum)
mother reports coughing with temp 99 started Friday evening.  Was worse, more frequent and deep sounding along with congestion.  Temp was still 99.  Has had dayquil and nyquil.  Sunday morning symptoms were worse, fever of 102.  Reports covid test negative.

## 2022-07-14 NOTE — Discharge Instructions (Addendum)
Arthur Dalton was negative for flu and strep. His symptoms are likely due to a viral respiratory infection. A respiratory infection is an illness that affects part of the respiratory system, such as the lungs, nose, or throat. Antibiotic medicines are not prescribed for viral infections. This is because antibiotics are designed to kill bacteria. They do not kill viruses. Keep doing the Nyquil, Dayquil and ibuprofen. Make sure Arthur Dalton drinks plenty of fluids. Go to the ED immediately if he get worse or develop any other concerning symptoms.

## 2022-07-14 NOTE — ED Provider Notes (Signed)
[place]    CSN: [id number] Arrival date & time: 07/14/22  1511      History   Chief Complaint Chief Complaint  Patient presents with   Cough   Appointment    15:30    HPI Arthur Dalton is a 22 y.o. male.   Subjective:   Arthur Dalton is a 22 y.o. male who presents for evaluation of symptoms of a URI. Symptoms include fevers up to 102 degrees, nasal congestion, cough and malaise. Patient is autistic and history mainly provided by mom. Onset of symptoms was 3 days ago and gradually worsening since that time. No vomiting or diarrhea. He has tried dayquil, nyquil and ibuprofen. He is drinking plenty of fluids. Dad has also been sick recently. Patient has possible flu exposure at school. Home COVID test 1 day ago was negative. Patient has been vaccinated against flu and COVID.   The following portions of the patient's history were reviewed and updated as appropriate: allergies, current medications, past family history, past medical history, past social history, past surgical history, and problem list.        Past Medical History:  Diagnosis Date   ADHD (attention deficit hyperactivity disorder)    Autism    Crohn disease ([place])     There are no problems to display for this patient.   Past Surgical History:  Procedure Laterality Date   TYMPANOSTOMY TUBE PLACEMENT         Home Medications    Prior to Admission medications   Medication Sig Start Date End Date Taking? Authorizing Provider  benzonatate (TESSALON) 100 MG capsule Take 1 capsule (100 mg total) by mouth every 8 (eight) hours as needed for cough. Patient not taking: Reported on 07/14/2022 10/21/21   Teodora Medici, FNP  cloNIDine (CATAPRES) 0.2 MG tablet Take 0.4 mg by mouth at bedtime.    [provider]  CLONIDINE HCL PO Take 4 mg by mouth daily at 8 pm.    [provider]  EPINEPHrine 0.3 mg/0.3 mL IJ SOAJ injection Inject 0.3 mg into the muscle daily as needed (allergic  reaction).    [provider]  fluticasone (FLONASE) 50 MCG/ACT nasal spray Place 1 spray into both nostrils daily.    [provider]  inFLIXimab (REMICADE IV) Inject into the vein. Every 6 weeks    [provider]  lamoTRIgine (LAMICTAL) 100 MG tablet Take 100 mg by mouth 2 (two) times daily.    [provider]  loratadine (CLARITIN) 10 MG tablet Take 10 mg by mouth every morning.    [provider]  methotrexate (RHEUMATREX) 2.5 MG tablet Take by mouth. 03/24/22   [provider]  methylphenidate (DAYTRANA) 15 mg/9hr Place 20 mg onto the skin daily. wear patch for 9 hours only each day Patient not taking: Reported on 07/14/2022    [provider]  montelukast (SINGULAIR) 5 MG chewable tablet Chew 5 mg by mouth at bedtime. Patient not taking: Reported on 07/14/2022    [provider]  mupirocin ointment (BACTROBAN) 2 % Apply 1 Application topically 2 (two) times daily. Apply twice daily as needed to affected area 05/08/22   Rudolpho Sevin, NP  ondansetron (ZOFRAN) 4 MG tablet Take 1 tablet (4 mg total) by mouth every 8 (eight) hours as needed for nausea or vomiting. 09/14/21   Blue, Soijett A, PA-C  polyethylene glycol (MIRALAX / GLYCOLAX) 17 g packet Take 17 g by mouth daily. 07/22/21   Lennice Sites,  DO  predniSONE (STERAPRED UNI-PAK 21 TAB) 10 MG (21) TBPK tablet Take by mouth daily. Take 6 tabs by mouth daily  for 2 days, then 5 tabs for 2 days, then 4 tabs for 2 days, then 3 tabs for 2 days, 2 tabs for 2 days, then 1 tab by mouth daily for 2 days Patient not taking: Reported on 07/14/2022 10/21/21   Teodora Medici, FNP  risperiDONE (RISPERDAL) 1 MG tablet Take 1 mg by mouth at bedtime.    [provider]    Family History Family History  Problem Relation Age of Onset   Hypertension Mother    Hypertension Father     Social History Social History   Tobacco Use   Smoking status: Never  Vaping Use   Vaping Use:  Never used  Substance Use Topics   Alcohol use: Never   Drug use: Never     Allergies   Peanuts [peanut oil]   Review of Systems Review of Systems  Constitutional:  Positive for fatigue and fever.  HENT:  Positive for congestion. Negative for rhinorrhea and sore throat.   Respiratory:  Positive for cough. Negative for shortness of breath.   Gastrointestinal:  Negative for diarrhea and vomiting.  Neurological:  Negative for headaches.  All other systems reviewed and are negative.    Physical Exam Triage Vital Signs ED Triage Vitals  Enc Vitals Group     BP 07/14/22 1554 119/83     Pulse Rate 07/14/22 1554 96     Resp 07/14/22 1554 20     Temp 07/14/22 1554 98.2 F (36.8 C)     Temp src --      SpO2 07/14/22 1554 99 %     Weight --      Height --      Head Circumference --      Peak Flow --      Pain Score 07/14/22 1551 0     Pain Loc --      Pain Edu? --      Excl. in [place]? --    No data found.  Updated Vital Signs BP 119/83 (BP Location: Left Arm)   Pulse 96   Temp 98.2 F (36.8 C)   Resp 20   SpO2 99%   Visual Acuity Right Eye Distance:   Left Eye Distance:   Bilateral Distance:    Right Eye Near:   Left Eye Near:    Bilateral Near:     Physical Exam Constitutional:      Appearance: Normal appearance.  HENT:     Head: Normocephalic.     Right Ear: Tympanic membrane, ear canal and external ear normal.     Left Ear: Tympanic membrane, ear canal and external ear normal.     Nose: Congestion present.     Mouth/Throat:     Mouth: Mucous membranes are moist.     Pharynx: Uvula midline. Pharyngeal swelling and posterior oropharyngeal erythema present. No oropharyngeal exudate or uvula swelling.     Tonsils: No tonsillar exudate or tonsillar abscesses.  Eyes:     Conjunctiva/sclera: Conjunctivae normal.  Cardiovascular:     Rate and Rhythm: Normal rate and regular rhythm.  Pulmonary:     Effort: Pulmonary effort is normal.     Breath sounds:  Normal breath sounds.  Musculoskeletal:        General: Normal range of motion.     Cervical back: Normal range of motion and neck supple.  Skin:  General: Skin is warm and dry.  Neurological:     General: No focal deficit present.     Mental Status: He is alert.      UC Treatments / Results  Labs (all labs ordered are listed, but only abnormal results are displayed) Labs Reviewed  POC INFLUENZA A AND B ANTIGEN (URGENT CARE ONLY)  POCT RAPID STREP A, ED / UC    EKG   Radiology No results found.  Procedures Procedures (including critical care time)  Medications Ordered in UC Medications - No data to display  Initial Impression / Assessment and Plan / UC Course  I have reviewed the triage vital signs and the nursing notes.  Pertinent labs & imaging results that were available during my care of the patient were reviewed by me and considered in my medical decision making (see chart for details).    22 yo autistic male presenting with viral URI.  Patient is afebrile.  Nontoxic.  Physical exam as above.  Flu and strep negative.  Home COVID-negative. Discussed diagnosis and treatment of URI. Discussed the importance of avoiding unnecessary antibiotic therapy. Suggested symptomatic OTC remedies. Follow up as needed.  Today's evaluation has revealed no signs of a dangerous process. Discussed diagnosis with patient and/or guardian. Patient and/or guardian aware of their diagnosis, possible red flag symptoms to watch out for and need for close follow up. Patient and/or guardian understands verbal and written discharge instructions. Patient and/or guardian comfortable with plan and disposition.  Patient and/or guardian has a clear mental status at this time, good insight into illness (after discussion and teaching) and has clear judgment to make decisions regarding their care  Documentation was completed with the aid of voice recognition software. Transcription may contain  typographical errors. Final Clinical Impressions(s) / UC Diagnoses   Final diagnoses:  Viral URI with cough     Discharge Instructions      Conner was negative for flu and strep. His symptoms are likely due to a viral respiratory infection. A respiratory infection is an illness that affects part of the respiratory system, such as the lungs, nose, or throat. Antibiotic medicines are not prescribed for viral infections. This is because antibiotics are designed to kill bacteria. They do not kill viruses. Keep doing the Nyquil, Dayquil and ibuprofen. Make sure Ples drinks plenty of fluids. Go to the ED immediately if he get worse or develop any other concerning symptoms.          ED Prescriptions   None    PDMP not reviewed this encounter.   Enrique Sack, [place] 07/14/22 1755

## 2022-08-12 ENCOUNTER — Encounter (HOSPITAL_COMMUNITY): Payer: Self-pay

## 2022-08-12 ENCOUNTER — Ambulatory Visit (HOSPITAL_COMMUNITY)
Admission: RE | Admit: 2022-08-12 | Discharge: 2022-08-12 | Disposition: A | Discharge status: Home / Self Care | Payer: Medicaid Other | Source: Ambulatory Visit

## 2022-08-12 VITALS — BP 118/78 | HR 101 | Temp 98.2°F | Resp 18

## 2022-08-12 DIAGNOSIS — H66012 Acute suppurative otitis media with spontaneous rupture of ear drum, left ear: Secondary | ICD-10-CM

## 2022-08-12 MED ORDER — CEFDINIR 300 MG PO CAPS: 300.0000 mg | ORAL_CAPSULE | Freq: Two times a day (BID) | ORAL | 0 refills | Status: AC

## 2022-08-12 MED ORDER — CIPROFLOXACIN-DEXAMETHASONE 0.3-0.1 % OT SUSP: 4.0000 [drp] | Freq: Two times a day (BID) | OTIC | 0 refills | Status: DC

## 2022-08-12 NOTE — Discharge Instructions (Signed)
Please read below to learn more about the medications, dosages and frequencies that I recommend to help alleviate your symptoms and to get you feeling better soon:   Omnicef (cefdinir):  Please take one (1) dose twice daily for 10 days.  This antibiotic can cause upset stomach, this will resolve once antibiotics are complete.  You are welcome to take a probiotic, eat yogurt, take Imodium while taking this medication.  Please avoid other systemic medications such as Maalox, Pepto-Bismol or milk of magnesia as they can interfere with the body's ability to absorb the antibiotics.   Ciprodex (ciprofloxacin, dexamethasone): Please instill 4 drops in the affected ear(s) twice daily for 7 days.     Please follow-up within the next 5-7 days either with your primary care provider or urgent care if your symptoms do not resolve.  If you do not have a primary care provider, we will assist you in finding one.        Thank you for visiting urgent care today.  We appreciate the opportunity to participate in your care.

## 2022-08-12 NOTE — ED Provider Notes (Signed)
[place]    CSN: [id number] Arrival date & time: 08/12/22  1309    HISTORY   Chief Complaint  Patient presents with   Ear Drainage    Complaining of pain in the left ear. - Entered by patient   Otalgia   HPI Arthur Dalton is a pleasant, 22 y.o. male who presents to urgent care today. Pts mom states that pt started complaining of left ear pain yesterday and low grade temp, TMAX unknown. She has been giving tylenol (last dose 9:30am) as needed.  Mom adds that the left ear is now draining.  The history is provided by a parent. The history is limited by a developmental delay.   Past Medical History:  Diagnosis Date   ADHD (attention deficit hyperactivity disorder)    Autism    Crohn disease ([place])    There are no problems to display for this patient.  Past Surgical History:  Procedure Laterality Date   TYMPANOSTOMY TUBE PLACEMENT      Home Medications    Prior to Admission medications   Medication Sig Start Date End Date Taking? Authorizing Provider  benzonatate (TESSALON) 100 MG capsule Take 1 capsule (100 mg total) by mouth every 8 (eight) hours as needed for cough. Patient not taking: Reported on 07/14/2022 10/21/21   Teodora Medici, FNP  cloNIDine (CATAPRES) 0.2 MG tablet Take 0.4 mg by mouth at bedtime.    [provider]  CLONIDINE HCL PO Take 4 mg by mouth daily at 8 pm.    [provider]  EPINEPHrine 0.3 mg/0.3 mL IJ SOAJ injection Inject 0.3 mg into the muscle daily as needed (allergic reaction).    [provider]  fluticasone (FLONASE) 50 MCG/ACT nasal spray Place 1 spray into both nostrils daily.    [provider]  inFLIXimab (REMICADE IV) Inject into the vein. Every 6 weeks    [provider]  lamoTRIgine (LAMICTAL) 100 MG tablet Take 100 mg by mouth 2 (two) times daily.    [provider]  loratadine (CLARITIN) 10 MG tablet Take 10 mg by mouth every morning.    [provider]   methotrexate (RHEUMATREX) 2.5 MG tablet Take by mouth. 03/24/22   [provider]  methylphenidate (DAYTRANA) 15 mg/9hr Place 20 mg onto the skin daily. wear patch for 9 hours only each day Patient not taking: Reported on 07/14/2022    [provider]  montelukast (SINGULAIR) 5 MG chewable tablet Chew 5 mg by mouth at bedtime. Patient not taking: Reported on 07/14/2022    [provider]  mupirocin ointment (BACTROBAN) 2 % Apply 1 Application topically 2 (two) times daily. Apply twice daily as needed to affected area 05/08/22   Rudolpho Sevin, NP  ondansetron (ZOFRAN) 4 MG tablet Take 1 tablet (4 mg total) by mouth every 8 (eight) hours as needed for nausea or vomiting. 09/14/21   Blue, Soijett A, PA-C  polyethylene glycol (MIRALAX / GLYCOLAX) 17 g packet Take 17 g by mouth daily. 07/22/21   Curatolo, Adam, DO  predniSONE (STERAPRED UNI-PAK 21 TAB) 10 MG (21) TBPK tablet Take by mouth daily. Take 6 tabs by mouth daily  for 2 days, then 5 tabs for 2 days, then 4 tabs for 2 days, then 3 tabs for 2 days, 2 tabs for 2 days, then 1 tab by mouth daily for 2 days Patient not taking: Reported on 07/14/2022 10/21/21   Teodora Medici, FNP  risperiDONE (RISPERDAL) 1 MG tablet  Take 1 mg by mouth at bedtime.    [provider]    Family History Family History  Problem Relation Age of Onset   Hypertension Mother    Hypertension Father    Social History Social History   Tobacco Use   Smoking status: Never  Vaping Use   Vaping Use: Never used  Substance Use Topics   Alcohol use: Never   Drug use: Never   Allergies   Peanuts [peanut oil]  Review of Systems Review of Systems Pertinent findings revealed after performing a 14 point review of systems has been noted in the history of present illness.  Physical Exam Vital Signs BP 118/78 (BP Location: Right Arm)   Pulse (!) 101   Temp 98.2 F (36.8 C) (Oral)   Resp 18   SpO2 97%   No data found.  Physical  Exam Vitals and nursing note reviewed.  Constitutional:      General: He is not in acute distress.    Appearance: Normal appearance. He is not ill-appearing.  HENT:     Head: Normocephalic and atraumatic.     Salivary Glands: Right salivary gland is not diffusely enlarged or tender. Left salivary gland is not diffusely enlarged or tender.     Right Ear: Hearing, tympanic membrane, ear canal and external ear normal. No drainage. No middle ear effusion. There is no impacted cerumen. Tympanic membrane is not erythematous or bulging.     Left Ear: Hearing, ear canal and external ear normal. Drainage present. A middle ear effusion is present. There is no impacted cerumen. Tympanic membrane is perforated and erythematous.     Nose: Nose normal. No nasal deformity, septal deviation, mucosal edema, congestion or rhinorrhea.     Right Turbinates: Not enlarged, swollen or pale.     Left Turbinates: Not enlarged, swollen or pale.     Right Sinus: No maxillary sinus tenderness or frontal sinus tenderness.     Left Sinus: No maxillary sinus tenderness or frontal sinus tenderness.     Mouth/Throat:     Lips: Pink. No lesions.     Mouth: Mucous membranes are moist. No oral lesions.     Pharynx: Oropharynx is clear. Uvula midline. No posterior oropharyngeal erythema or uvula swelling.     Tonsils: No tonsillar exudate. 0 on the right. 0 on the left.  Eyes:     General: Lids are normal.        Right eye: No discharge.        Left eye: No discharge.     Extraocular Movements: Extraocular movements intact.     Conjunctiva/sclera: Conjunctivae normal.     Right eye: Right conjunctiva is not injected.     Left eye: Left conjunctiva is not injected.  Neck:     Trachea: Trachea and phonation normal.  Cardiovascular:     Rate and Rhythm: Normal rate and regular rhythm.     Pulses: Normal pulses.     Heart sounds: Normal heart sounds. No murmur heard.    No friction rub. No gallop.  Pulmonary:     Effort:  Pulmonary effort is normal. No accessory muscle usage, prolonged expiration or respiratory distress.     Breath sounds: Normal breath sounds. No stridor, decreased air movement or transmitted upper airway sounds. No decreased breath sounds, wheezing, rhonchi or rales.  Chest:     Chest wall: No tenderness.  Musculoskeletal:        General: Normal range of motion.  Cervical back: Normal range of motion and neck supple. Normal range of motion.  Lymphadenopathy:     Cervical: No cervical adenopathy.  Skin:    General: Skin is warm and dry.     Findings: No erythema or rash.  Neurological:     General: No focal deficit present.     Mental Status: He is alert and oriented to person, place, and time.  Psychiatric:        Mood and Affect: Mood normal.        Behavior: Behavior normal.     Visual Acuity Right Eye Distance:   Left Eye Distance:   Bilateral Distance:    Right Eye Near:   Left Eye Near:    Bilateral Near:     UC Couse / Diagnostics / Procedures:     Radiology No results found.  Procedures Procedures (including critical care time) EKG  Pending results:  Labs Reviewed - No data to display  Medications Ordered in UC: Medications - No data to display  UC Diagnoses / Final Clinical Impressions(s)   I have reviewed the triage vital signs and the nursing notes.  Pertinent labs & imaging results that were available during my care of the patient were reviewed by me and considered in my medical decision making (see chart for details).    Final diagnoses:  Acute suppurative otitis media of left ear with spontaneous rupture of tympanic membrane, recurrence not specified   Patient provided with Ciprodex drops for pain and ruptured TM, did a course of Omnicef for acute left suppurative otitis media.  Conservative care recommended.  Return precautions advised.  Please see discharge instructions below for further details of plan of care as provided to patient. ED  Prescriptions     Medication Sig Dispense Auth. Provider   cefdinir (OMNICEF) 300 MG capsule Take 1 capsule (300 mg total) by mouth 2 (two) times daily for 10 days. 20 capsule Lynden Oxford Scales, PA-C   ciprofloxacin-dexamethasone (CIPRODEX) OTIC suspension Place 4 drops into the left ear 2 (two) times daily. X 7 days 7.5 mL Lynden Oxford Scales, PA-C      PDMP not reviewed this encounter.  Disposition Upon Discharge:  Condition: stable for discharge home Home: take medications as prescribed; routine discharge instructions as discussed; follow up as advised.  Patient presented with an acute illness with associated systemic symptoms and significant discomfort requiring urgent management. In my opinion, this is a condition that a prudent lay person (someone who possesses an average knowledge of health and medicine) may potentially expect to result in complications if not addressed urgently such as respiratory distress, impairment of bodily function or dysfunction of bodily organs.   Routine symptom specific, illness specific and/or disease specific instructions were discussed with the patient and/or caregiver at length.   As such, the patient has been evaluated and assessed, work-up was performed and treatment was provided in alignment with urgent care protocols and evidence based medicine.  Patient/parent/caregiver has been advised that the patient may require follow up for further testing and treatment if the symptoms continue in spite of treatment, as clinically indicated and appropriate.  If the patient was tested for COVID-19, Influenza and/or RSV, then the patient/parent/guardian was advised to isolate at home pending the results of his/her diagnostic coronavirus test and potentially longer if they're positive. I have also advised pt that if his/her COVID-19 test returns positive, it's recommended to self-isolate for at least 10 days after symptoms first appeared AND until fever-free  for 24 hours without fever reducer AND other symptoms have improved or resolved. Discussed self-isolation recommendations as well as instructions for household member/close contacts as per the [facility] and [place] DHHS, and also gave patient the [place] packet with this information.  Patient/parent/caregiver has been advised to return to the [facility] or PCP in 3-5 days if no better; to PCP or the Emergency Department if new signs and symptoms develop, or if the current signs or symptoms continue to change or worsen for further workup, evaluation and treatment as clinically indicated and appropriate  The patient will follow up with their current PCP if and as advised. If the patient does not currently have a PCP we will assist them in obtaining one.   The patient may need specialty follow up if the symptoms continue, in spite of conservative treatment and management, for further workup, evaluation, consultation and treatment as clinically indicated and appropriate.  Patient/parent/caregiver verbalized understanding and agreement of plan as discussed.  All questions were addressed during visit.  Please see discharge instructions below for further details of plan.  Discharge Instructions:   Discharge Instructions      Please read below to learn more about the medications, dosages and frequencies that I recommend to help alleviate your symptoms and to get you feeling better soon:   Omnicef (cefdinir):  Please take one (1) dose twice daily for 10 days.  This antibiotic can cause upset stomach, this will resolve once antibiotics are complete.  You are welcome to take a probiotic, eat yogurt, take Imodium while taking this medication.  Please avoid other systemic medications such as Maalox, Pepto-Bismol or milk of magnesia as they can interfere with the body's ability to absorb the antibiotics.   Ciprodex (ciprofloxacin, dexamethasone): Please instill 4 drops in the affected ear(s) twice daily for 7 days.     Please  follow-up within the next 5-7 days either with your primary care provider or urgent care if your symptoms do not resolve.  If you do not have a primary care provider, we will assist you in finding one.        Thank you for visiting urgent care today.  We appreciate the opportunity to participate in your care.       This office note has been dictated using Museum/gallery curator.  Unfortunately, this method of dictation can sometimes lead to typographical or grammatical errors.  I apologize for your inconvenience in advance if this occurs.  Please do not hesitate to reach out to me if clarification is needed.      Lynden Oxford Scales, PA-C 08/12/22 1346

## 2022-08-12 NOTE — ED Triage Notes (Signed)
Pts mom states that pt started complaining of left ear pain yesterday and low grade temp per mom. She has been giving tylenol (9:30am) as needed and today the left ear is draining.

## 2023-02-28 IMAGING — DX DG CHEST 2V
2 series · 2 of 2 positions shown · non-contrast
Comparison: September 14, 2021.

CLINICAL DATA: Cough and fever in a 21-year-old male.

EXAM:
CHEST - 2 VIEW

[chest pa]
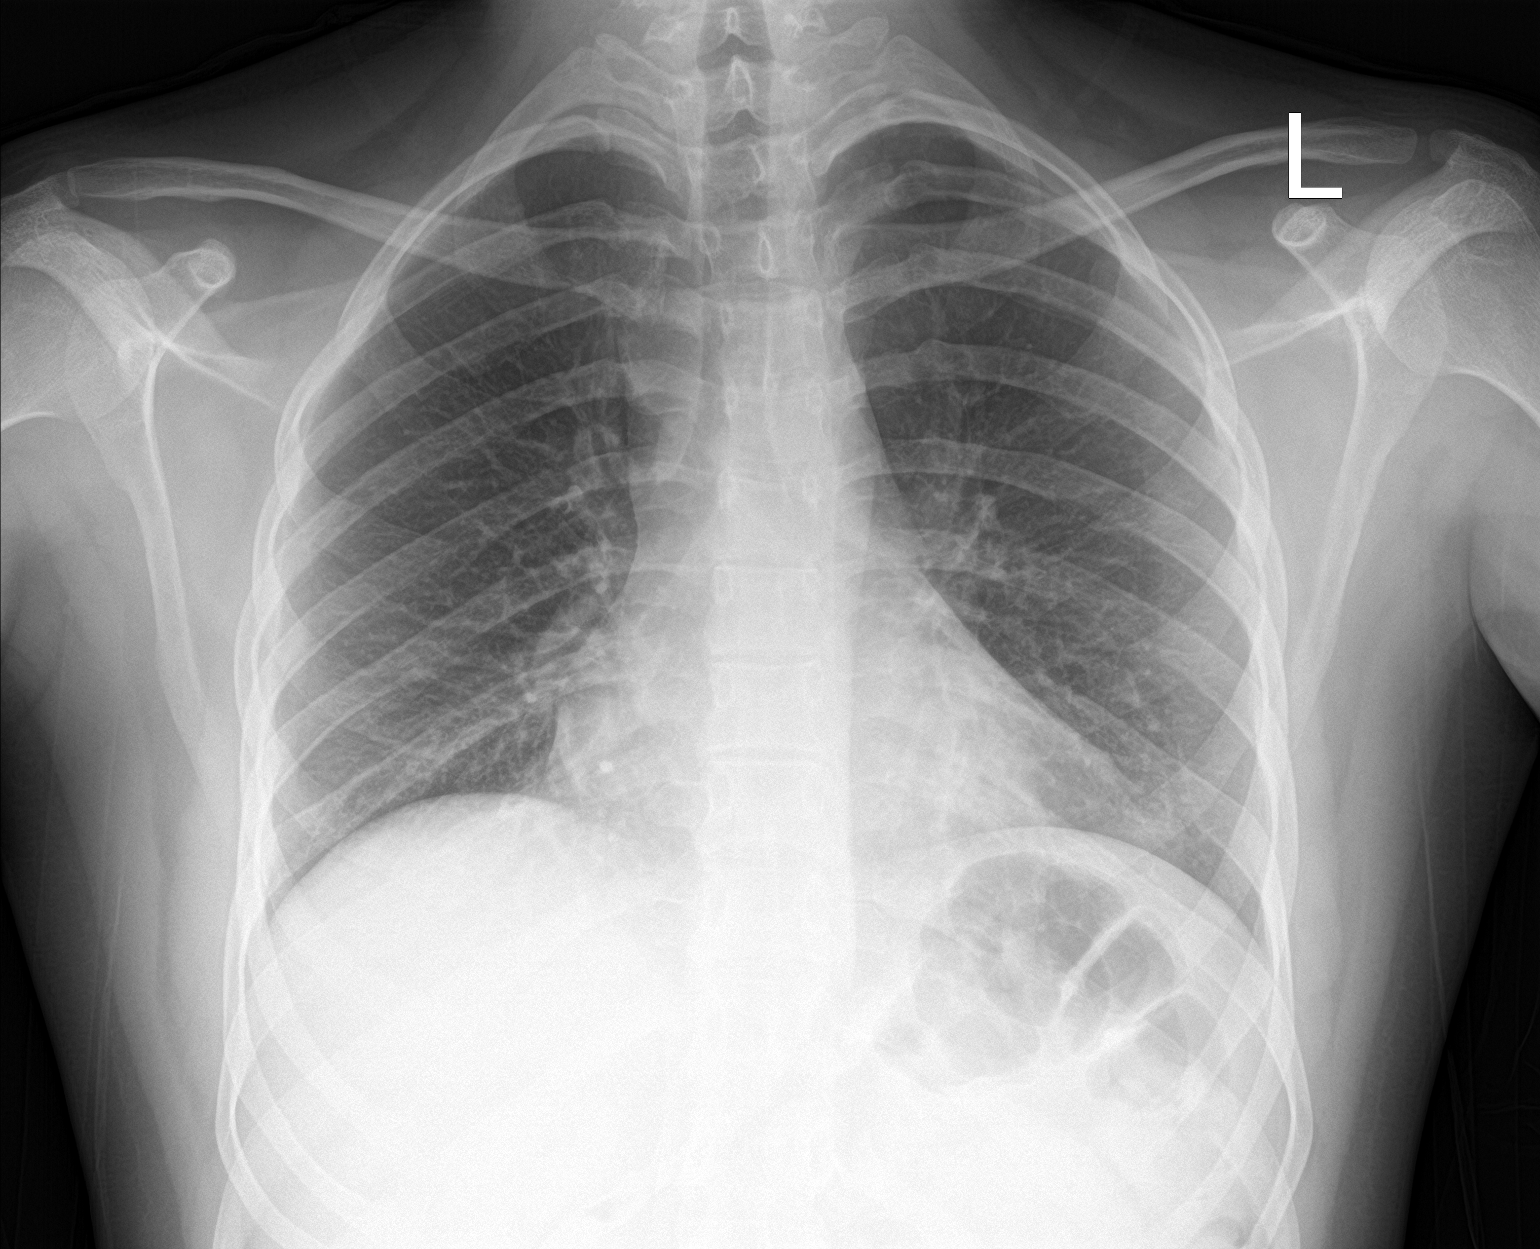

[chest lat]
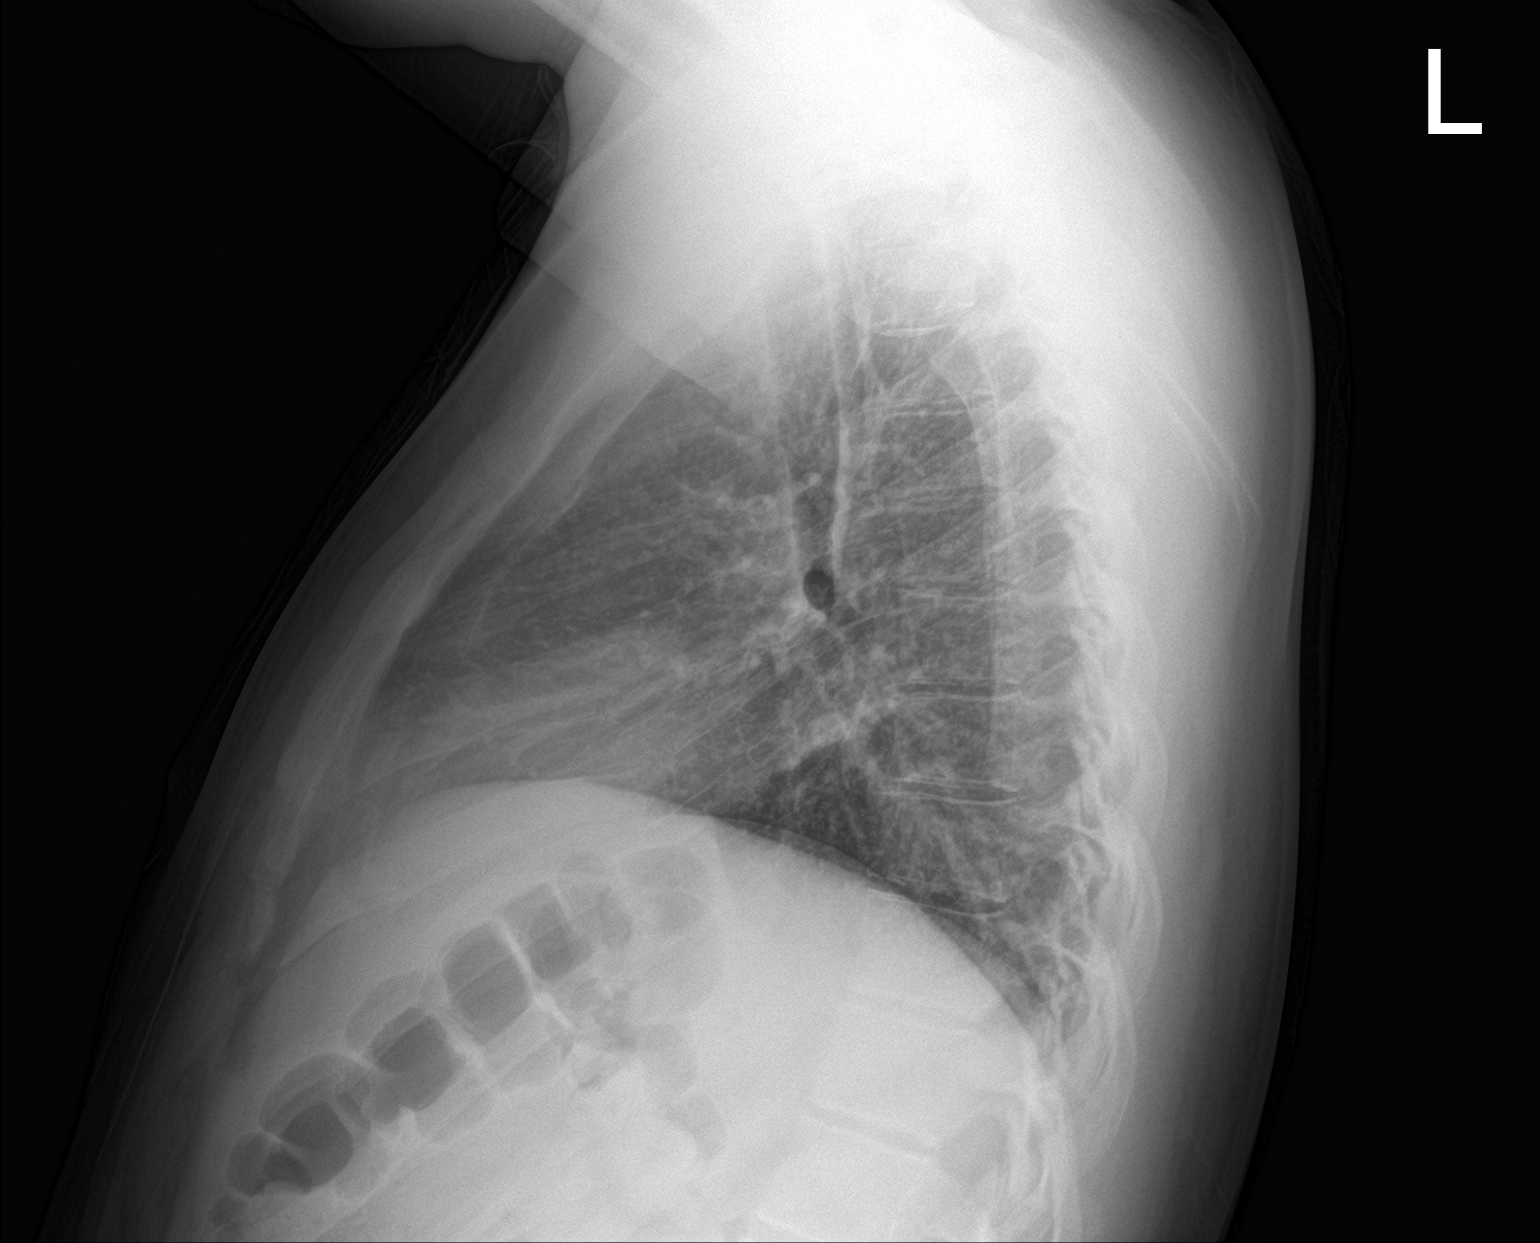

[2 of 2 positions shown; findings below may reference images not displayed]

FINDINGS: Trachea midline.

Cardiomediastinal contours and hilar structures are normal.

No sign of lobar consolidation. No sign of pleural effusion. No
visible pneumothorax.

On limited assessment there is no acute skeletal finding.
IMPRESSION: No acute cardiopulmonary disease

## 2023-06-25 ENCOUNTER — Telehealth: Payer: Self-pay | Admitting: Pediatrics

## 2023-06-25 NOTE — Telephone Encounter (Signed)
Just want to make sure this is ok before scheduling.   Copied from CRM [id number]. Topic: Appointments - Appointment Scheduling >> Jun 25, 2023 12:26 PM Almira Coaster wrote: Patient/patient representative is calling to schedule an appointment. Patient's father Arthur Dalton received approval for son to become a new patient with Dr.Duncan; however, the system will not allow me to schedule as a new patient. Arthur Dalton's best call back number [phone].

## 2023-06-28 NOTE — Telephone Encounter (Signed)
Please schedule. Thanks! 

## 2023-06-29 NOTE — Telephone Encounter (Signed)
LVM for patient to c/b and schedule.  

## 2023-09-30 ENCOUNTER — Telehealth: Payer: Self-pay

## 2023-09-30 NOTE — Telephone Encounter (Signed)
 Please set him up when possible.  Thanks.

## 2023-09-30 NOTE — Telephone Encounter (Signed)
 Copied from CRM [id number]. Topic: Appointments - Appointment Scheduling >> Sep 30, 2023  3:30 PM [name] wrote: Patient/patient representative is calling to schedule an appointment. Patient would like to schedule with Dr. Vallarie Gauze for physical. Patient's father [name]) is a patient of Dr. Vallarie Gauze. Father stated that Dr. Vallarie Gauze told him that he would be willing to see his son

## 2023-10-01 NOTE — Telephone Encounter (Signed)
 Please reach out to patient and schedule him an appointment with Dr. Vallarie Gauze. Thank you

## 2023-10-01 NOTE — Telephone Encounter (Signed)
 Spoke to pt's dad, scheduled new pt appt for 10/16/23

## 2023-10-16 ENCOUNTER — Encounter: Payer: Self-pay | Admitting: Family Medicine

## 2023-10-16 ENCOUNTER — Ambulatory Visit (INDEPENDENT_AMBULATORY_CARE_PROVIDER_SITE_OTHER): Payer: MEDICAID | Admitting: Family Medicine

## 2023-10-16 VITALS — BP 124/62 | HR 68 | Temp 98.5°F | Ht 67.72 in | Wt 185.8 lb

## 2023-10-16 DIAGNOSIS — F909 Attention-deficit hyperactivity disorder, unspecified type: Secondary | ICD-10-CM | POA: Diagnosis not present

## 2023-10-16 DIAGNOSIS — F840 Autistic disorder: Secondary | ICD-10-CM

## 2023-10-16 DIAGNOSIS — Z8669 Personal history of other diseases of the nervous system and sense organs: Secondary | ICD-10-CM | POA: Diagnosis not present

## 2023-10-16 DIAGNOSIS — G43909 Migraine, unspecified, not intractable, without status migrainosus: Secondary | ICD-10-CM

## 2023-10-16 DIAGNOSIS — Z91018 Allergy to other foods: Secondary | ICD-10-CM

## 2023-10-16 DIAGNOSIS — K5090 Crohn's disease, unspecified, without complications: Secondary | ICD-10-CM | POA: Diagnosis not present

## 2023-10-16 NOTE — Patient Instructions (Addendum)
 Please ask the front about records from Dr. Darlin Ehrlich and Dr. Frosty Jews for the last 3 years.   Please send a copy of your guardianship papers.  I'll check on nutrition options in the meantime.  Take care.  Glad to see you.

## 2023-10-18 ENCOUNTER — Encounter: Payer: Self-pay | Admitting: Family Medicine

## 2023-10-18 DIAGNOSIS — Z91018 Allergy to other foods: Secondary | ICD-10-CM | POA: Insufficient documentation

## 2023-10-18 DIAGNOSIS — F840 Autistic disorder: Secondary | ICD-10-CM | POA: Insufficient documentation

## 2023-10-18 DIAGNOSIS — K5090 Crohn's disease, unspecified, without complications: Secondary | ICD-10-CM | POA: Insufficient documentation

## 2023-10-18 DIAGNOSIS — F909 Attention-deficit hyperactivity disorder, unspecified type: Secondary | ICD-10-CM | POA: Insufficient documentation

## 2023-10-18 DIAGNOSIS — G43909 Migraine, unspecified, not intractable, without status migrainosus: Secondary | ICD-10-CM | POA: Insufficient documentation

## 2023-10-18 DIAGNOSIS — Z8669 Personal history of other diseases of the nervous system and sense organs: Secondary | ICD-10-CM | POA: Insufficient documentation

## 2023-10-18 MED ORDER — THERA VITAL M PO TABS: 1.0000 | ORAL_TABLET | Freq: Every day | ORAL | Status: AC

## 2023-10-18 MED ORDER — VITAMIN D3 25 MCG (1000 UT) PO CAPS: 1000.0000 [IU] | ORAL_CAPSULE | Freq: Every day | ORAL | Status: AC

## 2023-10-18 MED ORDER — FOLIC ACID 1 MG PO TABS: 1.0000 mg | ORAL_TABLET | Freq: Every day | ORAL | Status: AC

## 2023-10-18 MED ORDER — CETIRIZINE HCL 10 MG PO TABS: 10.0000 mg | ORAL_TABLET | Freq: Every day | ORAL | Status: DC

## 2023-10-18 NOTE — Progress Notes (Signed)
 New patient.  Requesting records from ENT and mental health clinic.  Tree nut allergy noted, along with peanuts.  Allergy list updated.  He has epinephrine to use if needed.  Followed by outside clinic, allergy clinic.  Previously eval by ENT, history of adenoidectomy.  History of multiple ear infections over the years.  History of Crohn's managed with infusions and methotrexate, per [facility].  Symptoms are controlled.  History of migraines, usually with pain around the left eye.  Noted with weather changes or specific triggers like bright lights.  He has nausea and vomiting with the episodes.  He has a few episodes per year.  Has follow-up with mental health clinic for ADHD and autism.  He has speech changes at baseline.  Attended high school.  Lives at home with family, parents and sibling.  Enjoys playing video games.  He has a history of being relatively inflexible with schedule changes.  He has difficulty being in a crowd.  He gets anxious if his parents were not home at night.  He has had difficulty with diet variation, especially related to the texture and smell of foods.  The foods that he does like tend to be crispy or salty.  Family has a meeting pending regarding community support services.  Previous labs discussed with patient and family.  Previous lipids were controlled.  Meds, vitals, and allergies reviewed.   ROS: Per HPI unless specifically indicated in ROS section   GEN: nad, alert and oriented, speech changes at baseline. HEENT: mucous membranes moist NECK: supple w/o LA CV: rrr. PULM: ctab, no inc wob ABD: soft, +bs EXT: no edema SKIN: Well-perfused

## 2023-10-18 NOTE — Assessment & Plan Note (Signed)
 Followed by mental health clinic.  This affects his diet choices and this was the main issue that we were talking about today.  His family has a meeting pending for community support services.  I do not think it makes sense to change his medications now.  I am trying to see if we can get him set up with nutrition for meal planning, taking into account his history of Crohn's and autism.

## 2023-10-18 NOTE — Assessment & Plan Note (Signed)
 Per outside clinic.

## 2023-10-18 NOTE — Assessment & Plan Note (Signed)
 Fortunately with relatively worse symptoms.  No change in medications at this point.

## 2023-10-18 NOTE — Assessment & Plan Note (Signed)
 Requesting records.

## 2023-10-18 NOTE — Assessment & Plan Note (Signed)
 Per [facility].  Symptoms controlled.

## 2023-10-18 NOTE — Assessment & Plan Note (Signed)
 Allergy list updated.  Has EpiPen to use in case of exposure/symptoms.

## 2024-03-14 ENCOUNTER — Encounter (HOSPITAL_COMMUNITY): Payer: Self-pay

## 2024-03-14 ENCOUNTER — Ambulatory Visit (HOSPITAL_COMMUNITY)
Admission: RE | Admit: 2024-03-14 | Discharge: 2024-03-14 | Disposition: A | Discharge status: Home / Self Care | Payer: MEDICAID | Source: Ambulatory Visit | Attending: Internal Medicine | Admitting: Internal Medicine

## 2024-03-14 VITALS — BP 120/88 | HR 106 | Temp 98.0°F | Resp 20

## 2024-03-14 DIAGNOSIS — J069 Acute upper respiratory infection, unspecified: Secondary | ICD-10-CM

## 2024-03-14 LAB — POC COVID19/FLU A&B COMBO
Covid Antigen, POC: NEGATIVE
Influenza A Antigen, POC: NEGATIVE
Influenza B Antigen, POC: NEGATIVE

## 2024-03-14 NOTE — ED Provider Notes (Signed)
 [facility]    CSN: [id number] Arrival date & time: 03/14/24  1445      History   Chief Complaint Chief Complaint  Patient presents with   Cough    Cough, Wet. Fever of 101.7 on Sunday a.m. reduced to 99.4 after given Dayquil. Cough still present, frequency reduced. Fatigue, moderate congestion. Cough started Saturday p.m. Sister has been sick with similar symptoms, negative flu/covid. - Entered by patient   Fever    HPI Arthur Dalton is a 23 y.o. male.   Arthur Dalton is a 23 y.o. male with history of autism and Crohn's disease presenting with father who provides the history for chief complaint of cough and congestion that started Saturday, March 12, 2024. Cough sounds wet to parents, he has had a fever up to 101.7 at home which has responded well to use of Tylenol .  He had 1 episode of posttussive emesis due to harsh cough at home on the first day of symptoms 2 days ago, he has not had any further nausea, vomiting, diarrhea, abdominal pain, rash, or dizziness.  He denies shortness of breath and chest pain.  No history of chronic respiratory problems.  His sister has recently been sick with similar symptoms.  Parents are giving DayQuil and NyQuil with some relief.   Cough Associated symptoms: fever   Fever Associated symptoms: cough     Past Medical History:  Diagnosis Date   ADHD (attention deficit hyperactivity disorder)    Allergy    Autism    Crohn disease (HCC)    History of otitis media    Migraine     Patient Active Problem List   Diagnosis Date Noted   Crohn's disease (HCC) 10/18/2023   Autism 10/18/2023   ADHD 10/18/2023   History of otitis media 10/18/2023   Nut allergy 10/18/2023   Migraine 10/18/2023    Past Surgical History:  Procedure Laterality Date   TYMPANOSTOMY TUBE PLACEMENT         Home Medications    Prior to Admission medications   Medication Sig Start Date End Date Taking? Authorizing Provider  cetirizine  (ZYRTEC )  10 MG tablet Take 1 tablet (10 mg total) by mouth daily. Patient not taking: Reported on 03/14/2024 10/18/23   Cleatus Arlyss RAMAN, MD  Cholecalciferol (VITAMIN D3) 25 MCG (1000 UT) CAPS Take 1 capsule (1,000 Units total) by mouth daily. 10/18/23   Cleatus Arlyss RAMAN, MD  cloNIDine (CATAPRES) 0.2 MG tablet Take 0.2 mg by mouth at bedtime.    [provider]  cloNIDine HCl (KAPVAY) 0.1 MG TB12 ER tablet Take 0.1 mg by mouth every morning.    [provider]  EPINEPHrine 0.3 mg/0.3 mL IJ SOAJ injection Inject 0.3 mg into the muscle daily as needed (allergic reaction).    [provider]  FLUoxetine (PROZAC) 10 MG capsule Take 10 mg by mouth daily.    [provider]  FLUoxetine (PROZAC) 20 MG tablet Take 20 mg by mouth daily.    [provider]  fluticasone (FLONASE) 50 MCG/ACT nasal spray Place 1 spray into both nostrils daily.    [provider]  folic acid  (FOLVITE ) 1 MG tablet Take 1 tablet (1 mg total) by mouth daily. 10/18/23   Cleatus Arlyss RAMAN, MD  inFLIXimab (REMICADE IV) Inject into the vein. Every 6 weeks    [provider]  lamoTRIgine (LAMICTAL) 100 MG tablet Take 100 mg by mouth 2 (two) times daily.    [provider]  methotrexate (RHEUMATREX) 2.5 MG tablet Take 7.5 mg by mouth once a week. 03/24/22   [provider]  Multiple Vitamins-Minerals (MULTIVITAMIN) tablet Take 1 tablet by mouth daily. 10/18/23   Cleatus Arlyss RAMAN, MD  risperiDONE (RISPERDAL) 1 MG tablet Take 1 mg by mouth at bedtime.    [provider]  VYVANSE 40 MG capsule Take 40 mg by mouth every morning. 08/08/22   [provider]    Family History Family History  Problem Relation Age of Onset   Hypertension Mother    Hypertension Father    Colon cancer Neg Hx    Prostate cancer Neg Hx     Social History Social History   Tobacco Use   Smoking status: Never  Vaping Use   Vaping status: Never Used  Substance Use Topics    Alcohol use: Never   Drug use: Never     Allergies   Peanuts [peanut oil] and Pecan extract   Review of Systems Review of Systems  Constitutional:  Positive for fever.  Respiratory:  Positive for cough.   Per HPI   Physical Exam Triage Vital Signs ED Triage Vitals  Encounter Vitals Group     BP 03/14/24 1515 120/88     Girls Systolic BP Percentile --      Girls Diastolic BP Percentile --      Boys Systolic BP Percentile --      Boys Diastolic BP Percentile --      Pulse Rate 03/14/24 1515 (!) 106     Resp 03/14/24 1515 20     Temp 03/14/24 1515 98 F (36.7 C)     Temp Source 03/14/24 1515 Oral     SpO2 03/14/24 1515 97 %     Weight --      Height --      Head Circumference --      Peak Flow --      Pain Score 03/14/24 1516 0     Pain Loc --      Pain Education --      Exclude from Growth Chart --    No data found.  Updated Vital Signs BP 120/88 (BP Location: Left Arm)   Pulse (!) 106   Temp 98 F (36.7 C) (Oral)   Resp 20   SpO2 97%   Visual Acuity Right Eye Distance:   Left Eye Distance:   Bilateral Distance:    Right Eye Near:   Left Eye Near:    Bilateral Near:     Physical Exam Vitals and nursing note reviewed.  Constitutional:      Appearance: He is not ill-appearing or toxic-appearing.  HENT:     Head: Normocephalic and atraumatic.     Right Ear: Hearing, tympanic membrane, ear canal and external ear normal.     Left Ear: Hearing, tympanic membrane, ear canal and external ear normal.     Nose: Congestion present.     Mouth/Throat:     Lips: Pink.     Mouth: Mucous membranes are moist. No injury or oral lesions.     Dentition: Normal dentition.     Tongue: No lesions.     Pharynx: Oropharynx is clear. Uvula midline. No pharyngeal swelling, oropharyngeal exudate, posterior oropharyngeal erythema, uvula swelling or postnasal drip.     Tonsils: No tonsillar exudate.  Eyes:     General: Lids are normal. Vision grossly intact. Gaze aligned  appropriately.     Extraocular Movements: Extraocular movements intact.  Conjunctiva/sclera: Conjunctivae normal.  Neck:     Trachea: Trachea and phonation normal.  Cardiovascular:     Rate and Rhythm: Normal rate and regular rhythm.     Heart sounds: Normal heart sounds, S1 normal and S2 normal.  Pulmonary:     Effort: Pulmonary effort is normal. No respiratory distress.     Breath sounds: Normal breath sounds and air entry. No wheezing, rhonchi or rales.  Chest:     Chest wall: No tenderness.  Musculoskeletal:     Cervical back: Neck supple.  Lymphadenopathy:     Cervical: No cervical adenopathy.  Skin:    General: Skin is warm and dry.     Capillary Refill: Capillary refill takes less than 2 seconds.     Findings: No rash.  Neurological:     General: No focal deficit present.     Mental Status: He is alert and oriented to person, place, and time. Mental status is at baseline.     Cranial Nerves: No dysarthria or facial asymmetry.  Psychiatric:        Mood and Affect: Mood normal.        Speech: Speech normal.        Behavior: Behavior normal.        Thought Content: Thought content normal.        Judgment: Judgment normal.      UC Treatments / Results  Labs (all labs ordered are listed, but only abnormal results are displayed) Labs Reviewed  POC COVID19/FLU A&B COMBO    EKG   Radiology No results found.  Procedures Procedures (including critical care time)  Medications Ordered in UC Medications - No data to display  Initial Impression / Assessment and Plan / UC Course  I have reviewed the triage vital signs and the nursing notes.  Pertinent labs & imaging results that were available during my care of the patient were reviewed by me and considered in my medical decision making (see chart for details).   1.  Viral URI with cough Suspect viral URI, viral syndrome.  Strep/viral testing: POC COVID-19 and flu testing negative.   Physical exam findings  reassuring, vital signs hemodynamically stable, and lungs clear, therefore deferred imaging of the chest.  Advised supportive care/prescriptions for symptomatic relief as outlined in AVS.    Counseled patient on potential for adverse effects with medications prescribed/recommended today, strict ER and return-to-clinic precautions discussed, patient verbalized understanding.    Final Clinical Impressions(s) / UC Diagnoses   Final diagnoses:  Viral URI with cough     Discharge Instructions      You have a viral illness which will improve on its own with rest, fluids, and medications to help with your symptoms.  Continue dayquil/nyquil as needed.  Two teaspoons of honey in 1 cup of warm water every 4-6 hours may help with throat pains.  Humidifier in room at nighttime may help soothe cough (clean well daily).   For chest pain, shortness of breath, inability to keep food or fluids down without vomiting, fever that does not respond to tylenol  or motrin, or any other severe symptoms, please go to the ER for further evaluation. Return to urgent care as needed, otherwise follow-up with PCP.      ED Prescriptions   None    PDMP not reviewed this encounter.   Enedelia Dorna HERO, [place] 03/14/24 1557

## 2024-03-14 NOTE — Discharge Instructions (Signed)
 You have a viral illness which will improve on its own with rest, fluids, and medications to help with your symptoms.  Continue dayquil/nyquil as needed.  Two teaspoons of honey in 1 cup of warm water every 4-6 hours may help with throat pains.  Humidifier in room at nighttime may help soothe cough (clean well daily).   For chest pain, shortness of breath, inability to keep food or fluids down without vomiting, fever that does not respond to tylenol  or motrin, or any other severe symptoms, please go to the ER for further evaluation. Return to urgent care as needed, otherwise follow-up with PCP.

## 2024-03-14 NOTE — ED Triage Notes (Signed)
 Father st's pt started with a cough 3 days ago and elevated temp.  Fever at home earlier today was 101  Last Tylenol  was at 12:40 pm today

## 2024-03-28 ENCOUNTER — Ambulatory Visit (INDEPENDENT_AMBULATORY_CARE_PROVIDER_SITE_OTHER): Payer: MEDICAID

## 2024-03-28 ENCOUNTER — Ambulatory Visit (HOSPITAL_COMMUNITY)
Admission: RE | Admit: 2024-03-28 | Discharge: 2024-03-28 | Disposition: A | Discharge status: Home / Self Care | Payer: MEDICAID | Source: Ambulatory Visit | Attending: Emergency Medicine | Admitting: Emergency Medicine

## 2024-03-28 ENCOUNTER — Encounter (HOSPITAL_COMMUNITY): Payer: Self-pay

## 2024-03-28 VITALS — BP 114/77 | HR 97 | Temp 97.7°F | Resp 16

## 2024-03-28 DIAGNOSIS — J209 Acute bronchitis, unspecified: Secondary | ICD-10-CM

## 2024-03-28 DIAGNOSIS — R051 Acute cough: Secondary | ICD-10-CM

## 2024-03-28 MED ORDER — BENZONATATE 100 MG PO CAPS: 100.0000 mg | ORAL_CAPSULE | Freq: Three times a day (TID) | ORAL | 0 refills | Status: DC

## 2024-03-28 MED ORDER — PREDNISONE 20 MG PO TABS: 40.0000 mg | ORAL_TABLET | Freq: Every day | ORAL | 0 refills | Status: AC

## 2024-03-28 NOTE — ED Provider Notes (Signed)
 [facility]    CSN: [id number] Arrival date & time: 03/28/24  1117      History   Chief Complaint Chief Complaint  Patient presents with   Cough    Cough lasting over 2 weeks, fever average of 100.5 seemed to improve, coughing less and low temp but got worse again Sat 10-18. Taking Dayquil and Nyquil when fever present. Robitussin DM on days w/no fever. Concern of bronchitis/pneumonia. - Entered by patient    HPI Arthur Dalton is a 23 y.o. male.   Patient presents with father for cough for about 2 weeks.  Mother states that patient began to have a cough and some mild congestion about 2 weeks ago and has had cough since, but reports the cough worsened over the weekend and he has now developed a fever as well.  Father reports however temperature of 100.5 over the weekend.  Father denies any signs of difficulty breathing or audible wheezing.  Father denies known vomiting or diarrhea.  Father reports that he has been giving DayQuil, NyQuil, Robitussin DM with some relief.  Father reports that patient is immunocompromised due to Crohn's disease and receiving Remicade infusions for this, and is concerned that he may have developed pneumonia because of this.  Patient does have a history of autism and therefore father is answering questions for him.  The history is provided by a relative. The history is limited by a developmental delay.  Cough   Past Medical History:  Diagnosis Date   ADHD (attention deficit hyperactivity disorder)    Allergy    Autism    Crohn disease (HCC)    History of otitis media    Migraine     Patient Active Problem List   Diagnosis Date Noted   Crohn's disease (HCC) 10/18/2023   Autism 10/18/2023   ADHD 10/18/2023   History of otitis media 10/18/2023   Nut allergy 10/18/2023   Migraine 10/18/2023    Past Surgical History:  Procedure Laterality Date   TYMPANOSTOMY TUBE PLACEMENT         Home Medications    Prior to Admission  medications   Medication Sig Start Date End Date Taking? Authorizing Provider  benzonatate  (TESSALON ) 100 MG capsule Take 1 capsule (100 mg total) by mouth every 8 (eight) hours. 03/28/24  Yes Johnie, Naida Escalante A, NP  cetirizine  (ZYRTEC ) 10 MG tablet Take 1 tablet (10 mg total) by mouth daily. 10/18/23  Yes Cleatus Arlyss RAMAN, MD  Cholecalciferol (VITAMIN D3) 25 MCG (1000 UT) CAPS Take 1 capsule (1,000 Units total) by mouth daily. 10/18/23  Yes Cleatus Arlyss RAMAN, MD  cloNIDine (CATAPRES) 0.2 MG tablet Take 0.2 mg by mouth at bedtime.   Yes [provider]  cloNIDine HCl (KAPVAY) 0.1 MG TB12 ER tablet Take 0.1 mg by mouth every morning.   Yes [provider]  EPINEPHrine 0.3 mg/0.3 mL IJ SOAJ injection Inject 0.3 mg into the muscle daily as needed (allergic reaction).   Yes [provider]  FLUoxetine (PROZAC) 10 MG capsule Take 10 mg by mouth daily.   Yes [provider]  FLUoxetine (PROZAC) 20 MG tablet Take 20 mg by mouth daily.   Yes [provider]  fluticasone (FLONASE) 50 MCG/ACT nasal spray Place 1 spray into both nostrils daily.   Yes [provider]  folic acid  (FOLVITE ) 1 MG tablet Take 1 tablet (1 mg total) by mouth daily. 10/18/23  Yes Cleatus Arlyss RAMAN, MD  inFLIXimab (REMICADE IV) Inject into the  vein. Every 6 weeks   Yes [provider]  lamoTRIgine (LAMICTAL) 100 MG tablet Take 100 mg by mouth 2 (two) times daily.   Yes [provider]  methotrexate (RHEUMATREX) 2.5 MG tablet Take 7.5 mg by mouth once a week. 03/24/22  Yes [provider]  Multiple Vitamins-Minerals (MULTIVITAMIN) tablet Take 1 tablet by mouth daily. 10/18/23  Yes Cleatus Arlyss RAMAN, MD  predniSONE  (DELTASONE ) 20 MG tablet Take 2 tablets (40 mg total) by mouth daily for 5 days. 03/28/24 04/02/24 Yes Tarl Cephas A, NP  risperiDONE (RISPERDAL) 1 MG tablet Take 1 mg by mouth at bedtime.   Yes [provider]  VYVANSE 40 MG capsule Take  40 mg by mouth every morning. 08/08/22  Yes [provider]    Family History Family History  Problem Relation Age of Onset   Hypertension Mother    Hypertension Father    Colon cancer Neg Hx    Prostate cancer Neg Hx     Social History Social History   Tobacco Use   Smoking status: Never  Vaping Use   Vaping status: Never Used  Substance Use Topics   Alcohol use: Never   Drug use: Never     Allergies   Peanuts [peanut oil] and Pecan extract   Review of Systems Review of Systems  Respiratory:  Positive for cough.    Per HPI  Physical Exam Triage Vital Signs ED Triage Vitals [03/28/24 1144]  Encounter Vitals Group     BP 114/77     Girls Systolic BP Percentile      Girls Diastolic BP Percentile      Boys Systolic BP Percentile      Boys Diastolic BP Percentile      Pulse Rate 97     Resp 16     Temp 97.7 F (36.5 C)     Temp Source Oral     SpO2 97 %     Weight      Height      Head Circumference      Peak Flow      Pain Score 0     Pain Loc      Pain Education      Exclude from Growth Chart    No data found.  Updated Vital Signs BP 114/77 (BP Location: Right Arm)   Pulse 97   Temp 97.7 F (36.5 C) (Oral)   Resp 16   SpO2 97%   Visual Acuity Right Eye Distance:   Left Eye Distance:   Bilateral Distance:    Right Eye Near:   Left Eye Near:    Bilateral Near:     Physical Exam Vitals and nursing note reviewed.  Constitutional:      General: He is awake. He is not in acute distress.    Appearance: Normal appearance. He is well-developed and well-groomed. He is not ill-appearing.  HENT:     Right Ear: Tympanic membrane, ear canal and external ear normal.     Left Ear: Tympanic membrane, ear canal and external ear normal.     Nose: Congestion and rhinorrhea present.     Mouth/Throat:     Mouth: Mucous membranes are moist.     Pharynx: Posterior oropharyngeal erythema and postnasal drip present. No oropharyngeal exudate.   Cardiovascular:     Rate and Rhythm: Normal rate and regular rhythm.  Pulmonary:     Effort: Pulmonary effort is normal.     Breath sounds: Normal  breath sounds.  Skin:    General: Skin is warm and dry.  Neurological:     General: No focal deficit present.     Mental Status: He is alert. Mental status is at baseline.  Psychiatric:        Behavior: Behavior is cooperative.      UC Treatments / Results  Labs (all labs ordered are listed, but only abnormal results are displayed) Labs Reviewed - No data to display  EKG   Radiology DG Chest 2 View Result Date: 03/28/2024 EXAM: 2 VIEW(S) XRAY OF THE CHEST 03/28/2024 12:18:31 PM COMPARISON: 10/21/21 CLINICAL HISTORY: Cough x 2 weeks, worsened over last few days and new fever. Cough lasting over 2 weeks, fever average of 100.66F seemed to improve, coughing less and low temp but got worse again Sat 10-18. Taking Dayquil and Nyquil when fever present. Robitussin DM on days w/no fever. Concern of bronchitis/pneumonia. Dad states that he is immuno compromised so they are worried about the cough since its ongoing and no fever over weekend. FINDINGS: LUNGS AND PLEURA: No focal pulmonary opacity. No pulmonary edema. No pleural effusion. No pneumothorax. HEART AND MEDIASTINUM: No acute abnormality of the cardiac and mediastinal silhouettes. BONES AND SOFT TISSUES: No acute osseous abnormality. IMPRESSION: 1. No acute process. Electronically signed by: Waddell Calk MD 03/28/2024 02:01 PM EDT RP Workstation: [id number]    Procedures Procedures (including critical care time)  Medications Ordered in UC Medications - No data to display  Initial Impression / Assessment and Plan / UC Course  I have reviewed the triage vital signs and the nursing notes.  Pertinent labs & imaging results that were available during my care of the patient were reviewed by me and considered in my medical decision making (see chart for details).     Patient is  overall well-appearing.  Vitals are stable.  Congestion and rhinorrhea are present, erythema and PND noted to posterior oropharynx.  Lungs clear bilaterally on auscultation.  Chest x-ray ordered to rule out underlying pneumonia.  I independently interpreted these images and there is no active cardiopulmonary disease.  Radiology report confirms this.    Symptoms likely related to a viral bronchitis.  Prescribed short course of prednisone  for this.  Prescribed Tessalon  as needed for cough.  Discussed over-the-counter medications as needed for symptoms.  Discussed follow-up and return precautions. Final Clinical Impressions(s) / UC Diagnoses   Final diagnoses:  Acute cough  Acute bronchitis, unspecified organism     Discharge Instructions      His x-ray is negative for any underlying pneumonia.  I believe his symptoms are likely related to a viral bronchitis. I have prescribed a short course of prednisone  to help with this.  Have him start taking 2 tablets once daily for 5 days. I have also prescribed Tessalon  that he can take every 8 hours as needed for cough. He can alternate between Tylenol  and ibuprofen as needed for any fever or pain. Otherwise recommend over-the-counter Mucinex for cough and congestion. Follow-up with primary care provider or return here if symptoms persist or worsen.     ED Prescriptions     Medication Sig Dispense Auth. Provider   predniSONE  (DELTASONE ) 20 MG tablet Take 2 tablets (40 mg total) by mouth daily for 5 days. 10 tablet Johnie Flaming A, NP   benzonatate  (TESSALON ) 100 MG capsule Take 1 capsule (100 mg total) by mouth every 8 (eight) hours. 21 capsule Johnie Flaming A, NP      PDMP not reviewed  this encounter.   Johnie Flaming A, NP 03/28/24 [phone]

## 2024-03-28 NOTE — Discharge Instructions (Addendum)
 His x-ray is negative for any underlying pneumonia.  I believe his symptoms are likely related to a viral bronchitis. I have prescribed a short course of prednisone  to help with this.  Have him start taking 2 tablets once daily for 5 days. I have also prescribed Tessalon  that he can take every 8 hours as needed for cough. He can alternate between Tylenol  and ibuprofen as needed for any fever or pain. Otherwise recommend over-the-counter Mucinex for cough and congestion. Follow-up with primary care provider or return here if symptoms persist or worsen.

## 2024-03-28 NOTE — ED Triage Notes (Signed)
 Cough lasting over 2 weeks, fever average of 100.5 seemed to improve, coughing less and low temp but got worse again Sat 10-18. Taking Dayquil and Nyquil when fever present. Robitussin DM on days w/no fever. Concern of bronchitis/pneumonia.    Dad states that he is immuno compromised so they are worried about the cough since its on going and no fever over weekend.

## 2024-04-25 ENCOUNTER — Ambulatory Visit: Payer: MEDICAID | Admitting: Family

## 2024-04-25 VITALS — BP 130/84 | HR 98 | Temp 98.0°F | Wt 188.0 lb

## 2024-04-25 DIAGNOSIS — J22 Unspecified acute lower respiratory infection: Secondary | ICD-10-CM | POA: Diagnosis not present

## 2024-04-25 DIAGNOSIS — B9689 Other specified bacterial agents as the cause of diseases classified elsewhere: Secondary | ICD-10-CM

## 2024-04-25 MED ORDER — AMOXICILLIN 875 MG PO TABS: 875.0000 mg | ORAL_TABLET | Freq: Two times a day (BID) | ORAL | 0 refills | Status: AC

## 2024-04-25 NOTE — Progress Notes (Unsigned)
 Established Patient Office Visit  Subjective:      CC:  Chief Complaint  Patient presents with  . Cough    Onset was 6 weeks ago, coughing so hard at times that he is vomiting. Was seen twice at urgent cares, was tested for COVID, strep and flu. All were negative.    HPI: Arthur Dalton is a 23 y.o. male presenting on 04/25/2024 for Cough (Onset was 6 weeks ago, coughing so hard at times that he is vomiting. Was seen twice at urgent cares, was tested for COVID, strep and flu. All were negative.) .  Discussed the use of AI scribe software for clinical note transcription with the patient, who gave verbal consent to proceed.  History of Present Illness Arthur Dalton is a 23 year old male with autism and Crohn's disease who presents with persistent cough and congestion.  He has been experiencing a persistent cough and congestion since October 4th, 2025. Initially, he had a fever up to 101F and one episode of emesis due to severe coughing. COVID-19, flu, and strep tests were negative. Despite these symptoms, he continued his Remicade infusions for Crohn's disease.  On October 20th, he visited urgent care again due to a cough persisting for over two weeks, mild congestion, and a fever of 100.53F over the weekend. A chest X-ray was performed to rule out pneumonia. He was prescribed prednisone  and Tessalon  for the cough, though Tessalon  was ineffective.  In the past week, his cough has been severe enough to induce vomiting, with the most recent episode occurring on Friday night. His parents considered taking him to the ER, but his symptoms subsided en route. He has been taking Allegra for several months, having switched from Zyrtec , but continues to experience significant sniffling and reduced energy levels. No throat pain, and his temperature has been slightly elevated but not feverish.  His current medications include Remicade, methotrexate, Vyvanse, ibuprofen as needed, clonidine (0.1 mg  in the morning and 0.2 mg at night), Lamictal, Allegra, risperidone, folic acid , Flonase, and Prozac (30 mg). He has a history of large tonsils and earwax buildup, but no recent ear pain. His parents note that he has a high pain tolerance, likely due to his Crohn's disease.         Social history:  Relevant past medical, surgical, family and social history reviewed and updated as indicated. Interim medical history since our last visit reviewed.  Allergies and medications reviewed and updated.  DATA REVIEWED: CHART IN EPIC     ROS: Negative unless specifically indicated above in HPI.    Current Outpatient Medications:  .  amoxicillin  (AMOXIL ) 875 MG tablet, Take 1 tablet (875 mg total) by mouth 2 (two) times daily for 10 days., Disp: 20 tablet, Rfl: 0 .  Cholecalciferol (VITAMIN D3) 25 MCG (1000 UT) CAPS, Take 1 capsule (1,000 Units total) by mouth daily., Disp: , Rfl:  .  cloNIDine (CATAPRES) 0.2 MG tablet, Take 0.2 mg by mouth at bedtime., Disp: , Rfl:  .  cloNIDine HCl (KAPVAY) 0.1 MG TB12 ER tablet, Take 0.1 mg by mouth every morning., Disp: , Rfl:  .  EPINEPHrine 0.3 mg/0.3 mL IJ SOAJ injection, Inject 0.3 mg into the muscle daily as needed (allergic reaction)., Disp: , Rfl:  .  fexofenadine (ALLEGRA) 180 MG tablet, Take 180 mg by mouth daily., Disp: , Rfl:  .  FLUoxetine (PROZAC) 10 MG capsule, Take 10 mg by mouth daily., Disp: , Rfl:  .  FLUoxetine (PROZAC) 20  MG tablet, Take 20 mg by mouth daily., Disp: , Rfl:  .  fluticasone (FLONASE) 50 MCG/ACT nasal spray, Place 1 spray into both nostrils daily., Disp: , Rfl:  .  folic acid  (FOLVITE ) 1 MG tablet, Take 1 tablet (1 mg total) by mouth daily., Disp: , Rfl:  .  inFLIXimab (REMICADE IV), Inject into the vein. Every 6 weeks, Disp: , Rfl:  .  lamoTRIgine (LAMICTAL) 100 MG tablet, Take 100 mg by mouth 2 (two) times daily., Disp: , Rfl:  .  methotrexate (RHEUMATREX) 2.5 MG tablet, Take 7.5 mg by mouth once a week., Disp: , Rfl:   .  Multiple Vitamins-Minerals (MULTIVITAMIN) tablet, Take 1 tablet by mouth daily., Disp: , Rfl:  .  risperiDONE (RISPERDAL) 1 MG tablet, Take 1 mg by mouth at bedtime., Disp: , Rfl:  .  VYVANSE 40 MG capsule, Take 40 mg by mouth every morning., Disp: , Rfl:         Objective:        BP 130/84 (BP Location: Left Arm, Patient Position: Sitting, Cuff Size: Large)   Pulse 98   Temp 98 F (36.7 C) (Temporal)   Wt 188 lb (85.3 kg)   SpO2 98%   BMI 28.82 kg/m   Physical Exam HEENT: Cerumen obstructing view in one ear. Tonsils enlarged bilaterally. No sinus tenderness. Throat slightly erythematous.  Wt Readings from Last 3 Encounters:  04/25/24 188 lb (85.3 kg)  10/16/23 185 lb 12.8 oz (84.3 kg)  07/22/21 178 lb (80.7 kg)    Physical Exam Vitals reviewed.  Constitutional:      General: He is not in acute distress.    Appearance: Normal appearance. He is normal weight. He is not ill-appearing, toxic-appearing or diaphoretic.  HENT:     Head: Normocephalic.     Right Ear: Tympanic membrane normal.     Left Ear: Tympanic membrane normal.     Nose: Congestion and rhinorrhea present.     Right Turbinates: Swollen.     Mouth/Throat:     Mouth: Mucous membranes are moist.     Pharynx: Postnasal drip present.     Tonsils: 3+ on the right. 3+ on the left.  Eyes:     Pupils: Pupils are equal, round, and reactive to light.  Cardiovascular:     Rate and Rhythm: Normal rate and regular rhythm.  Pulmonary:     Effort: Pulmonary effort is normal.     Breath sounds: Normal breath sounds. No wheezing.  Musculoskeletal:        General: Normal range of motion.     Cervical back: Normal range of motion.  Neurological:     General: No focal deficit present.     Mental Status: He is alert and oriented to person, place, and time. Mental status is at baseline.  Psychiatric:        Mood and Affect: Mood normal.        Behavior: Behavior normal.        Thought Content: Thought content  normal.        Judgment: Judgment normal.          Results RADIOLOGY Chest X-ray: No active disease, findings suggestive of a viral infection (03/28/2024)  Assessment & Plan:   Assessment and Plan Assessment & Plan Acute persistent cough with upper respiratory symptoms Persistent cough since October 4th, with mild congestion and intermittent fever. Negative for COVID, flu, and strep. Chest x-ray showed no active disease. Symptoms suggestive of viral etiology,  but considering bacterial infection due to symptom duration. Differential includes viral infection and bacterial infection. Previous prednisone  provided temporary relief. No recent antibiotics. Decision to switch allergy medication and consider antibiotics due to symptom persistence. - Switched allergy medication from Allegra to Zyrtec  or Xyzal. - Prescribed amoxicillin  for potential bacterial infection. - Continue Flonase and consider saline spray to enhance effectiveness.  Allergic rhinitis Chronic allergic rhinitis with persistent sniffling and congestion. Current treatment with Allegra is ineffective. Previous use of Zyrtec  was effective. Decision to switch back to Zyrtec  due to persistent symptoms. - Switched allergy medication from Allegra to Zyrtec  or Xyzal. - Continue Flonase and consider saline spray to enhance effectiveness.  Crohn's disease Managed with Remicade and methotrexate. Recent infusion of Remicade. Immune system compromised due to medication regimen. Consideration of antibiotic choice due to Crohn's disease. - Continue Remicade and methotrexate as prescribed. - Prescribed amoxicillin , avoiding Augmentin due to Crohn's disease.  Autism spectrum disorder - Continue current medication regimen for autism spectrum disorder.  Recording duration: 13 minutes      No follow-ups on file.     Ginger Patrick, MSN, APRN, FNP-C [facility]
